# Patient Record
Sex: Female | Born: 1990 | Race: Asian | Hispanic: No | Marital: Single | Smoking: Never smoker
Health system: Southern US, Community
[De-identification: ages and names within clinical notes are randomized; demographics above are authoritative.]

## PROBLEM LIST (undated history)

## (undated) ENCOUNTER — Emergency Department (HOSPITAL_COMMUNITY): Admission: EM | Payer: Medicaid Other | Source: Home / Self Care

## (undated) ENCOUNTER — Inpatient Hospital Stay (HOSPITAL_COMMUNITY): Payer: Self-pay

## (undated) DIAGNOSIS — O0930 Supervision of pregnancy with insufficient antenatal care, unspecified trimester: Secondary | ICD-10-CM

## (undated) DIAGNOSIS — Z789 Other specified health status: Secondary | ICD-10-CM

## (undated) HISTORY — PX: NO PAST SURGERIES: SHX2092

## (undated) HISTORY — DX: Supervision of pregnancy with insufficient antenatal care, unspecified trimester: O09.30

---
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
## 2010-01-05 ENCOUNTER — Ambulatory Visit (HOSPITAL_COMMUNITY): Admission: RE | Admit: 2010-01-05 | Discharge: 2010-01-05 | Payer: Self-pay | Admitting: Obstetrics & Gynecology

## 2010-05-05 ENCOUNTER — Inpatient Hospital Stay (HOSPITAL_COMMUNITY): Admission: AD | Admit: 2010-05-05 | Discharge: 2010-05-07 | Payer: Self-pay | Admitting: Obstetrics & Gynecology

## 2010-05-05 ENCOUNTER — Ambulatory Visit: Payer: Self-pay | Admitting: Advanced Practice Midwife

## 2011-01-05 LAB — CBC
MCH: 21.3 pg — ABNORMAL LOW (ref 26.0–34.0)
MCHC: 32.3 g/dL (ref 30.0–36.0)
MCV: 65.8 fL — ABNORMAL LOW (ref 78.0–100.0)
Platelets: 353 10*3/uL (ref 150–400)
RDW: 16.5 % — ABNORMAL HIGH (ref 11.5–15.5)
WBC: 15.9 10*3/uL — ABNORMAL HIGH (ref 4.0–10.5)

## 2011-02-04 LAB — RPR: RPR Ser Ql: NONREACTIVE

## 2011-02-04 LAB — CBC: RDW: 13.9 % (ref 11.4–15.5)

## 2011-07-23 LAB — URINALYSIS, ROUTINE W REFLEX MICROSCOPIC
Bilirubin Urine: NEGATIVE
Hgb urine dipstick: NEGATIVE
Protein, ur: NEGATIVE
Specific Gravity, Urine: 1.02

## 2012-08-28 ENCOUNTER — Encounter: Payer: Self-pay | Admitting: *Deleted

## 2012-09-16 ENCOUNTER — Encounter: Payer: Self-pay | Admitting: Obstetrics & Gynecology

## 2012-10-04 ENCOUNTER — Inpatient Hospital Stay (HOSPITAL_COMMUNITY): Payer: Medicaid Other

## 2012-10-04 ENCOUNTER — Encounter (HOSPITAL_COMMUNITY): Payer: Self-pay | Admitting: *Deleted

## 2012-10-04 ENCOUNTER — Inpatient Hospital Stay (HOSPITAL_COMMUNITY)
Admission: AD | Admit: 2012-10-04 | Discharge: 2012-10-04 | Disposition: A | Discharge status: Home / Self Care | Payer: Medicaid Other | Source: Ambulatory Visit | Attending: Obstetrics & Gynecology | Admitting: Obstetrics & Gynecology

## 2012-10-04 DIAGNOSIS — R0602 Shortness of breath: Secondary | ICD-10-CM | POA: Insufficient documentation

## 2012-10-04 DIAGNOSIS — N949 Unspecified condition associated with female genital organs and menstrual cycle: Secondary | ICD-10-CM

## 2012-10-04 DIAGNOSIS — O99891 Other specified diseases and conditions complicating pregnancy: Secondary | ICD-10-CM | POA: Insufficient documentation

## 2012-10-04 DIAGNOSIS — R109 Unspecified abdominal pain: Secondary | ICD-10-CM | POA: Insufficient documentation

## 2012-10-04 DIAGNOSIS — O0930 Supervision of pregnancy with insufficient antenatal care, unspecified trimester: Secondary | ICD-10-CM | POA: Insufficient documentation

## 2012-10-04 LAB — URINALYSIS, ROUTINE W REFLEX MICROSCOPIC
Bilirubin Urine: NEGATIVE
Glucose, UA: NEGATIVE mg/dL
Hgb urine dipstick: NEGATIVE
Protein, ur: NEGATIVE mg/dL
pH: 7.5 (ref 5.0–8.0)

## 2012-10-04 LAB — URINE MICROSCOPIC-ADD ON

## 2012-10-04 NOTE — MAU Note (Signed)
Pt reports having a constant lower abd pain and then intermitant cramping. C/O SOB especially when when lays down to sleep. Has not started prenatal care has appointment with health dept in 2 weeks.

## 2012-10-04 NOTE — MAU Provider Note (Addendum)
History     CSN: [id number]  Arrival date and time: 10/04/12 4098   First Provider Initiated Contact with Patient 10/04/12 1830      Chief Complaint  Patient presents with  . Abdominal Cramping   HPI: Pt is a 21 y.o. G3P2002 (dates uncertain; 32.1 by LMP, 27.5 by pt-reported due date of 12/29/2012) presenting with low abdominal pain and SOB when lying flat. States the abdominal pain is in her low abdomen on both sides and worse when she is standing or walking. SOB is only when she lies completely flat and makes it hard for her to rest unless she is on one side or the other, and makes her "very uncomfortable," especially in combination with the pain.   Otherwise has few complaints. No fever/chills, no significant nausea, no vomiting. Reports good PO intake especially of water. No headache, change in vision, or RUQ pain.  Pt has had no prenatal, yet. Her first OB visit at [facility] is on 12/23. She reports no complications with her previous two pregnancies and states they were both delivered at term ("around 38-39 weeks, a week or two early"). Specifically denies problems with blood pressures or blood sugars.  OB History    Grav Para Term Preterm Abortions TAB SAB Ect Mult Living   3 2 2       2       History reviewed. No pertinent past medical history.  History reviewed. No pertinent past surgical history.  History reviewed. No pertinent family history.  History  Substance Use Topics  . Smoking status: Never Smoker   . Smokeless tobacco: Not on file  . Alcohol Use: No    Allergies: Allergies not on file  No prescriptions prior to admission    ROS: See HPI  Physical Exam   Blood pressure 113/65, pulse 94, temperature 97.9 F (36.6 C), temperature source Oral, resp. rate 18, height 5' (1.524 m), weight 61.689 kg (136 lb), last menstrual period 02/22/2012.  Physical Exam  Vitals reviewed. Constitutional: She is oriented to person, place, and time. She  appears well-developed and well-nourished. No distress.  HENT:  Head: Normocephalic and atraumatic.  Eyes: Conjunctivae normal are normal. Pupils are equal, round, and reactive to light.  Neck: Normal range of motion. Neck supple.  Cardiovascular: Normal rate, regular rhythm and normal heart sounds.   No murmur heard. Respiratory: Effort normal and breath sounds normal. She has no wheezes.  GI: Soft. Bowel sounds are normal. She exhibits no distension. There is tenderness (mild bilateral low abdomenal tenderness). There is no rebound.       Fundal height 30 cm above pubic symphysis  Genitourinary: Vagina normal. Pelvic exam was performed with patient prone.       Dilation: Fingertip (internal os closed but cervix feels slightly funneled) Effacement (%): 50 Cervical Position: Posterior Station: Ballotable Presentation: Undeterminable Exam by:: Street, MD; Arita Miss, RN  Musculoskeletal: Normal range of motion. She exhibits no edema.  Neurological: She is alert and oriented to person, place, and time.  Skin: Skin is warm and dry.  Psychiatric: She has a normal mood and affect. Her behavior is normal.    MAU Course  Procedures -fetal fibronectin (collection attempted but swab came out bloody, unable to be sent) -wet prep -US OB limited  MDM -FHR tracing reassuring, baseline 140, mod variability, good accels, single variable decels -cervix fingertip (closed internal os) / 50 / ballotable, but feels funneled between external and internal os -wet prep pending -[place] pending  *  RADIOLOGY REPORT*  Clinical Data: Evaluate cervical length and placental location  LIMITED OBSTETRIC ULTRASOUND  Number of Fetuses: 1  Heart Rate: 122 bpm  Movement: Present  Presentation: Breech  Placental Location: Anterior  Previa: Absent  Amniotic Fluid (Subjective): Normal  Vertical pocket: 4.8cm AFI: 16.5 cm (5%ile 8.8 cm, 95%ile 23.8 cm)  BPD: 7.86cm 31w 4d  MATERNAL FINDINGS:  Cervix: Closed,  measuring 3.2 cm  Uterus/Adnexae: Bilateral ovaries are not discretely visualized.  IMPRESSION:  Single live intrauterine gestation with estimated gestational age  [redacted] weeks 4 days by BPD.  Cervical length 3.2 cm.  No evidence of placental previa.  Original Report Authenticated By: Charline Bills, M.D.    Assessment and Plan  21 y.o. [id number] SIUP, late to care  -most likely round ligament pain and general discomforts of late pregnancy  -dates uncertain; 32.1 by LMP, 27.5 by pt-reported due date, with fundal height 30 cm  -labs and [place] pending  Above discussed with Artelia Laroche, CNM. Pt/care to be checked out/turned over to oncoming providers.  Street, Christopher 10/04/2012, 7:58 PM

## 2012-10-04 NOTE — MAU Provider Note (Signed)
Seen and agree, except may not be just round ligament pain There is uterine irritability and this may represent preterm contractions Will check [place] and reevaluate Wynelle Bourgeois cnm

## 2012-10-04 NOTE — MAU Note (Signed)
"  I have been having some lower abdominal cramping and hard to breathe all day.  (+) FM.  I have my first prenatal appointment with the [facility] on 10/12/12.  The pain is worse when I move around.  The SOB happens when I lay back.  NO VB or LOF."

## 2012-10-06 LAB — URINE CULTURE

## 2012-10-06 NOTE — MAU Provider Note (Signed)
Attestation of Attending Supervision of Resident: Evaluation and management procedures were performed by the Family Medicine Resident under my supervision.  I have reviewed the resident's note and chart, and I agree with the management and plan.  Tashianna Broome, MD, FACOG Attending Obstetrician & Gynecologist Faculty Practice, [facility]  

## 2012-10-11 ENCOUNTER — Inpatient Hospital Stay (HOSPITAL_COMMUNITY)
Admission: AD | Admit: 2012-10-11 | Discharge: 2012-10-11 | Disposition: A | Discharge status: Home / Self Care | Payer: Medicaid Other | Source: Ambulatory Visit | Attending: Obstetrics & Gynecology | Admitting: Obstetrics & Gynecology

## 2012-10-11 ENCOUNTER — Inpatient Hospital Stay (HOSPITAL_COMMUNITY): Payer: Medicaid Other

## 2012-10-11 ENCOUNTER — Encounter (HOSPITAL_COMMUNITY): Payer: Self-pay | Admitting: Family

## 2012-10-11 DIAGNOSIS — O0930 Supervision of pregnancy with insufficient antenatal care, unspecified trimester: Secondary | ICD-10-CM | POA: Insufficient documentation

## 2012-10-11 DIAGNOSIS — O368190 Decreased fetal movements, unspecified trimester, not applicable or unspecified: Secondary | ICD-10-CM | POA: Insufficient documentation

## 2012-10-11 DIAGNOSIS — N390 Urinary tract infection, site not specified: Secondary | ICD-10-CM | POA: Insufficient documentation

## 2012-10-11 DIAGNOSIS — Z3689 Encounter for other specified antenatal screening: Secondary | ICD-10-CM

## 2012-10-11 DIAGNOSIS — O2390 Unspecified genitourinary tract infection in pregnancy, unspecified trimester: Secondary | ICD-10-CM | POA: Insufficient documentation

## 2012-10-11 DIAGNOSIS — O2340 Unspecified infection of urinary tract in pregnancy, unspecified trimester: Secondary | ICD-10-CM

## 2012-10-11 HISTORY — DX: Other specified health status: Z78.9

## 2012-10-11 LAB — URINALYSIS, ROUTINE W REFLEX MICROSCOPIC
Bilirubin Urine: NEGATIVE
Hgb urine dipstick: NEGATIVE
Ketones, ur: NEGATIVE mg/dL

## 2012-10-11 LAB — URINE MICROSCOPIC-ADD ON

## 2012-10-11 MED ORDER — CEPHALEXIN 500 MG PO CAPS: 500.0000 mg | ORAL_CAPSULE | Freq: Four times a day (QID) | ORAL | Status: DC

## 2012-10-11 NOTE — MAU Provider Note (Signed)
History     CSN: [id number]  Arrival date and time: 10/11/12 4098   None     Chief Complaint  Patient presents with  . Decreased Fetal Movement   HPI  Pt is a G3P2002 at 33.1 wks IUP here with report of decreased fetal movement since 0500 this am.  No report of vaginal bleeding or leaking of fluids.  Occasional Deberah Pelton.  Appt at [facility] Dept tomorrow.    Past Medical History  Diagnosis Date  . No pertinent past medical history     Past Surgical History  Procedure Date  . No past surgeries     History reviewed. No pertinent family history.  History  Substance Use Topics  . Smoking status: Never Smoker   . Smokeless tobacco: Not on file  . Alcohol Use: No    Allergies: No Known Allergies  Prescriptions prior to admission  Medication Sig Dispense Refill  . acetaminophen (TYLENOL) 325 MG tablet Take 650 mg by mouth every 6 (six) hours as needed.      . Prenatal Vit-Fe Fumarate-FA (MULTIVITAMIN-PRENATAL) 27-0.8 MG TABS Take 1 tablet by mouth daily.        Review of Systems  Constitutional:       Decreased fetal movement.  Gastrointestinal: Positive for abdominal pain [facility]).  Genitourinary: Negative.   All other systems reviewed and are negative.   Physical Exam   Blood pressure 120/71, pulse 93, temperature 97.3 F (36.3 C), temperature source Oral, resp. rate 18, height 5' (1.524 m), weight 60.782 kg (134 lb), last menstrual period 02/22/2012, SpO2 100.00%.  Physical Exam  Constitutional: She is oriented to person, place, and time. She appears well-developed and well-nourished. No distress.  HENT:  Head: Normocephalic.  Neck: Normal range of motion. Neck supple.  Cardiovascular: Normal rate, regular rhythm and normal heart sounds.   Respiratory: Effort normal and breath sounds normal.  GI: Soft. There is no tenderness.       Fundal ht 32  Genitourinary: No bleeding around the vagina. Vaginal discharge (mucusy) found.  Neurological: She  is alert and oriented to person, place, and time.  Skin: Skin is warm and dry.  Dilation: Closed Effacement (%): Thick Cervical Position: Posterior Exam by:: Roney Marion, CNM  FHR 110's, +accels, reactive Toco - irritability   MAU Course  Procedures Results for orders placed during the hospital encounter of 10/11/12 (from the past 24 hour(s))  URINALYSIS, ROUTINE W REFLEX MICROSCOPIC     Status: Abnormal   Collection Time   10/11/12  6:24 AM      Component Value Range   Color, Urine YELLOW  YELLOW   APPearance HAZY (*) CLEAR   Specific Gravity, Urine 1.010  1.005 - 1.030   pH 7.5  5.0 - 8.0   Glucose, UA NEGATIVE  NEGATIVE mg/dL   Hgb urine dipstick NEGATIVE  NEGATIVE   Bilirubin Urine NEGATIVE  NEGATIVE   Ketones, ur NEGATIVE  NEGATIVE mg/dL   Protein, ur NEGATIVE  NEGATIVE mg/dL   Urobilinogen, UA 0.2  0.0 - 1.0 mg/dL   Nitrite NEGATIVE  NEGATIVE   Leukocytes, UA MODERATE (*) NEGATIVE  URINE MICROSCOPIC-ADD ON     Status: Abnormal   Collection Time   10/11/12  6:24 AM      Component Value Range   Squamous Epithelial / LPF FEW (*) RARE   WBC, UA 11-20  <3 WBC/hpf   Bacteria, UA FEW (*) RARE   Imaging:  *RADIOLOGY REPORT*  Clinical Data: 21 year old  G3 P2, EDD 11/28/2012 (33 weeks 1 day),  presenting with decreased fetal movement.   LIMITED OBSTETRIC ULTRASOUND WITH BIOPHYSICAL PROFILE  Number of Fetuses: 1  Heart Rate: 130 bpm  Movement: Visualized.  Breathing: Not visualized.  Presentation: Transverse, head maternal right.  Placental Location: Anterior  Previa: No.  Amniotic Fluid (Subjective): Normal.  Vertical Pocket 5.6 cm AFI 14.2 cm (5%ile = 8.3 cm; 95%ile =  24.5 cm)  BPD: 8.1 cm 32 w 4 d EDC: 12/02/2012   MATERNAL FINDINGS:  Cervix: Closed, approximately 3.3 cm in length.  Uterus/Adnexae: Nonvisualization of the ovaries. No adnexal  masses or free pelvic fluid.  BIOPHYSICAL PROFILE  Movement: 2 Time: 30 minutes  Breathing: 0  Tone: 2  Amniotic  Fluid: 2  Total Score: 6/8   IMPRESSION:  1. Single live intrauterine fetus in transverse presentation (head  maternal right) with estimated gestational age [redacted] weeks 4 days by  BPD, corresponding well with the assigned gestational age of [redacted]  weeks 1 day.  2. Biophysical profile score 6/8 (fetal breathing was not  visualized).  3. Subjectively and objectively normal amniotic fluid.  4. Closed cervix measuring approximately 3.3 cm in length.  Recommend followup with non-emergent complete OB 14+ wk US  examination for fetal biometric evaluation and anatomic survey if  not already performed.  Original Report Authenticated By: Hulan Saas, M.D.    Assessment and Plan  A: UTI in pregnancy Late Prenatal Care  Reactive NST  P: Discharge home Rx for Keflex sent to pharmacy Patient informed to keep appointment to start prenatal care at [facility] as scheduled   [facility] 10/11/2012, 7:06 AM   Report given to N. Bascom Levels who assumes care of patient.

## 2012-10-11 NOTE — MAU Note (Signed)
Pt reports no fetal movement x 1.5 hours, lower abd pain

## 2012-10-12 LAB — URINE CULTURE
Colony Count: NO GROWTH
Culture: NO GROWTH

## 2012-10-20 LAB — OB RESULTS CONSOLE RUBELLA ANTIBODY, IGM: Rubella: IMMUNE

## 2012-10-20 LAB — OB RESULTS CONSOLE GC/CHLAMYDIA: Chlamydia: NEGATIVE

## 2012-10-20 LAB — OB RESULTS CONSOLE HEPATITIS B SURFACE ANTIGEN: Hepatitis B Surface Ag: NEGATIVE

## 2012-10-20 LAB — OB RESULTS CONSOLE HIV ANTIBODY (ROUTINE TESTING): HIV: NONREACTIVE

## 2012-10-20 LAB — OB RESULTS CONSOLE ABO/RH: RH Type: POSITIVE

## 2012-10-20 LAB — OB RESULTS CONSOLE RPR: RPR: NONREACTIVE

## 2012-10-21 NOTE — L&D Delivery Note (Signed)
I was present for the delivery and agree with above. I performed the repair of the 2nd degree laceration.   Placenta to: BS Feeding: Breast Circ: NA Contraception: Condoms   [place], [place] 12/06/2012 9:19 PM

## 2012-10-21 NOTE — L&D Delivery Note (Signed)
Delivery Note At 8:25 PM a viable female was delivered via Vaginal, Spontaneous Delivery (Presentation: Right Occiput Anterior).  APGAR: 9, 9; weight .   Placenta status: Intact, Spontaneous.  Cord: 3 vessels with the following complications: None.    Anesthesia:   Episiotomy: None Lacerations: 2nd degree Suture Repair: 3.0 vicryl Est. Blood Loss (mL): 400  Mom to postpartum.  Baby to nursery-stable.  Mat Carne 12/06/2012, 8:50 PM

## 2012-10-22 ENCOUNTER — Other Ambulatory Visit (HOSPITAL_COMMUNITY): Payer: Self-pay | Admitting: Nurse Practitioner

## 2012-10-22 DIAGNOSIS — Z0489 Encounter for examination and observation for other specified reasons: Secondary | ICD-10-CM

## 2012-10-26 ENCOUNTER — Ambulatory Visit (HOSPITAL_COMMUNITY)
Admission: RE | Admit: 2012-10-26 | Discharge: 2012-10-26 | Disposition: A | Discharge status: Home / Self Care | Payer: Medicaid Other | Source: Ambulatory Visit | Attending: Nurse Practitioner | Admitting: Nurse Practitioner

## 2012-10-26 DIAGNOSIS — Z3689 Encounter for other specified antenatal screening: Secondary | ICD-10-CM | POA: Insufficient documentation

## 2012-10-26 DIAGNOSIS — Z0489 Encounter for examination and observation for other specified reasons: Secondary | ICD-10-CM

## 2012-10-26 DIAGNOSIS — O0930 Supervision of pregnancy with insufficient antenatal care, unspecified trimester: Secondary | ICD-10-CM | POA: Insufficient documentation

## 2012-11-10 LAB — OB RESULTS CONSOLE GBS: GBS: NEGATIVE

## 2012-11-15 ENCOUNTER — Encounter (HOSPITAL_COMMUNITY): Payer: Self-pay | Admitting: Obstetrics and Gynecology

## 2012-11-15 ENCOUNTER — Inpatient Hospital Stay (HOSPITAL_COMMUNITY)
Admission: AD | Admit: 2012-11-15 | Discharge: 2012-11-15 | Disposition: A | Discharge status: Home / Self Care | Payer: Medicaid Other | Source: Ambulatory Visit | Attending: Obstetrics & Gynecology | Admitting: Obstetrics & Gynecology

## 2012-11-15 DIAGNOSIS — O479 False labor, unspecified: Secondary | ICD-10-CM | POA: Insufficient documentation

## 2012-11-15 NOTE — MAU Note (Signed)
"  I started having contractions this morning.  This afternoon they became about every 15 mins.  Now they are about every 10 mins.  No VB or LOF. (+) FM."

## 2012-11-15 NOTE — MAU Provider Note (Signed)
Attestation of Attending Supervision of Advanced Practitioner (CNM/NP): Evaluation and management procedures were performed by the Advanced Practitioner under my supervision and collaboration.  I have reviewed the Advanced Practitioner's note and chart, and I agree with the management and plan.  HARRAWAY-SMITH, Kaiyu Mirabal 9:24 PM     

## 2012-11-15 NOTE — MAU Provider Note (Signed)
Chief Complaint:  Contractions   First Provider Initiated Contact with Patient 11/15/12 2112      HPI: Rebecca Bryan is a 22 y.o. G3P2002 at [redacted]w[redacted]d who presents to maternity admissions reporting contractions beginning this AM.  Pt states that she started to feel contractions this AM about 20-30 minutes apart with slight abdominal pain/pressure. Her contractions increased to every 10-15 minutes this afternoon and decided to come in.  Pt [facility] her prenatal [facility] department and was checked one week ago (2/10/-3).   Denies leakage of fluid or vaginal bleeding. Good fetal movement.   Pregnancy Course:   Past Medical History: Past Medical History  Diagnosis Date  . No pertinent past medical history     Past obstetric history: OB History    Grav Para Term Preterm Abortions TAB SAB Ect Mult Living   3 2 2       2      # Outc Date GA Lbr Len/2nd Wgt Sex Del Anes PTL Lv   1 TRM 2010 [redacted]w[redacted]d  6lb2oz(2.778kg) M SVD None  Yes   Comments: No complications   2 TRM 2011 [redacted]w[redacted]d  6lb(2.722kg) F SVD None  Yes   3 CUR               Past Surgical History: Past Surgical History  Procedure Date  . No past surgeries     Family History: History reviewed. No pertinent family history.  Social History: History  Substance Use Topics  . Smoking status: Never Smoker   . Smokeless tobacco: Not on file  . Alcohol Use: No    Allergies: No Known Allergies  Meds:  Prescriptions prior to admission  Medication Sig Dispense Refill  . Prenatal Vit-Fe Fumarate-FA (PRENATAL MULTIVITAMIN) TABS Take 1 tablet by mouth daily.        ROS: Pertinent findings in history of present illness.  Physical Exam  Blood pressure 113/75, pulse 94, temperature 98.7 F (37.1 C), temperature source Oral, resp. rate 18, height 5' (1.524 m), weight 61.871 kg (136 lb 6.4 oz), last menstrual period 02/22/2012. GENERAL: Well-developed, well-nourished female in no acute distress.  HEENT: normocephalic HEART:  normal rate RESP: normal effort ABDOMEN: Soft, non-tender, gravid appropriate for gestational age EXTREMITIES: Nontender, no edema NEURO: alert and oriented  Dilation: 2 Effacement (%): 20 Cervical Position: Posterior Station: -3 Exam by:: Dr. Paulina Fusi  FHT:  Baseline 150 , moderate variability, accelerations present, no decelerations Contractions: 10-15 minutes    Labs: No results found for this or any previous visit (from the past 24 hour(s)).  Imaging:  US Ob Comp + 14 Wk  10/26/2012  OBSTETRICAL ULTRASOUND: This exam was performed within a [facility]. The OB US report was generated in the AS system, and faxed to the ordering physician.   This report is also available in [employer] and in the [employer]. See [facility] Obstetric US report.   Assessment: 1. Braxton Hick's contraction     Plan: Discharge home Labor precautions and fetal kick counts    Medication List     As of 11/15/2012  9:15 PM    ASK your doctor about these medications         prenatal multivitamin Tabs   Take 1 tablet by mouth daily.        [name], DO of [facility] 11/15/2012, 9:15 PM  I have seen the patient with the resident and agree with the above.

## 2012-12-04 ENCOUNTER — Other Ambulatory Visit: Payer: Medicaid Other

## 2012-12-04 ENCOUNTER — Telehealth (HOSPITAL_COMMUNITY): Payer: Self-pay | Admitting: *Deleted

## 2012-12-04 ENCOUNTER — Encounter (HOSPITAL_COMMUNITY): Payer: Self-pay | Admitting: *Deleted

## 2012-12-04 NOTE — Telephone Encounter (Signed)
Preadmission screen  

## 2012-12-06 ENCOUNTER — Inpatient Hospital Stay (HOSPITAL_COMMUNITY)
Admission: AD | Admit: 2012-12-06 | Discharge: 2012-12-08 | DRG: 775 | Disposition: A | Discharge status: Home / Self Care | Payer: Medicaid Other | Source: Ambulatory Visit | Attending: Obstetrics & Gynecology | Admitting: Obstetrics & Gynecology

## 2012-12-06 ENCOUNTER — Encounter (HOSPITAL_COMMUNITY): Payer: Self-pay | Admitting: *Deleted

## 2012-12-06 HISTORY — DX: Other specified health status: Z78.9

## 2012-12-06 LAB — CBC
HCT: 34.1 % — ABNORMAL LOW (ref 36.0–46.0)
Hemoglobin: 11 g/dL — ABNORMAL LOW (ref 12.0–15.0)
MCV: 66 fL — ABNORMAL LOW (ref 78.0–100.0)
RBC: 5.17 MIL/uL — ABNORMAL HIGH (ref 3.87–5.11)
RDW: 15.7 % — ABNORMAL HIGH (ref 11.5–15.5)
WBC: 16.4 10*3/uL — ABNORMAL HIGH (ref 4.0–10.5)

## 2012-12-06 MED ORDER — OXYTOCIN 40 UNITS IN LACTATED RINGERS INFUSION - SIMPLE MED
INTRAVENOUS | Status: AC
  Filled 2012-12-06: qty 1000

## 2012-12-06 MED ORDER — LACTATED RINGERS IV SOLN
INTRAVENOUS | Status: DC
  Administered 2012-12-06: 19:00:00 via INTRAVENOUS

## 2012-12-06 MED ORDER — LIDOCAINE HCL (PF) 1 % IJ SOLN
INTRAMUSCULAR | Status: AC
  Filled 2012-12-06: qty 30

## 2012-12-06 MED ORDER — OXYCODONE-ACETAMINOPHEN 5-325 MG PO TABS: 1.0000 | ORAL_TABLET | ORAL | Status: DC | PRN

## 2012-12-06 MED ORDER — OXYTOCIN BOLUS FROM INFUSION
500.0000 mL | INTRAVENOUS | Status: DC
  Administered 2012-12-06: 500 mL via INTRAVENOUS

## 2012-12-06 MED ORDER — SIMETHICONE 80 MG PO CHEW: 80.0000 mg | CHEWABLE_TABLET | ORAL | Status: DC | PRN

## 2012-12-06 MED ORDER — DIPHENHYDRAMINE HCL 25 MG PO CAPS: 25.0000 mg | ORAL_CAPSULE | Freq: Four times a day (QID) | ORAL | Status: DC | PRN

## 2012-12-06 MED ORDER — TETANUS-DIPHTH-ACELL PERTUSSIS 5-2.5-18.5 LF-MCG/0.5 IM SUSP
0.5000 mL | Freq: Once | INTRAMUSCULAR | Status: AC
  Administered 2012-12-07: 0.5 mL via INTRAMUSCULAR
  Filled 2012-12-06: qty 0.5

## 2012-12-06 MED ORDER — ONDANSETRON HCL 4 MG/2ML IJ SOLN: 4.0000 mg | Freq: Four times a day (QID) | INTRAMUSCULAR | Status: DC | PRN

## 2012-12-06 MED ORDER — IBUPROFEN 600 MG PO TABS
600.0000 mg | ORAL_TABLET | Freq: Four times a day (QID) | ORAL | Status: DC | PRN
  Administered 2012-12-06: 600 mg via ORAL
  Filled 2012-12-06: qty 1

## 2012-12-06 MED ORDER — IBUPROFEN 600 MG PO TABS
600.0000 mg | ORAL_TABLET | Freq: Four times a day (QID) | ORAL | Status: DC
  Administered 2012-12-07 – 2012-12-08 (×6): 600 mg via ORAL
  Filled 2012-12-06 (×6): qty 1

## 2012-12-06 MED ORDER — ONDANSETRON HCL 4 MG PO TABS: 4.0000 mg | ORAL_TABLET | ORAL | Status: DC | PRN

## 2012-12-06 MED ORDER — NALBUPHINE SYRINGE 5 MG/0.5 ML: 10.0000 mg | INJECTION | INTRAMUSCULAR | Status: DC | PRN

## 2012-12-06 MED ORDER — OXYCODONE-ACETAMINOPHEN 5-325 MG PO TABS
1.0000 | ORAL_TABLET | ORAL | Status: DC | PRN
  Administered 2012-12-06 – 2012-12-07 (×2): 1 via ORAL
  Filled 2012-12-06 (×2): qty 1

## 2012-12-06 MED ORDER — OXYTOCIN 40 UNITS IN LACTATED RINGERS INFUSION - SIMPLE MED: 62.5000 mL/h | INTRAVENOUS | Status: DC

## 2012-12-06 MED ORDER — CITRIC ACID-SODIUM CITRATE 334-500 MG/5ML PO SOLN: 30.0000 mL | ORAL | Status: DC | PRN

## 2012-12-06 MED ORDER — SENNOSIDES-DOCUSATE SODIUM 8.6-50 MG PO TABS
2.0000 | ORAL_TABLET | Freq: Every day | ORAL | Status: DC
  Administered 2012-12-07: 2 via ORAL

## 2012-12-06 MED ORDER — DIBUCAINE 1 % RE OINT: 1.0000 "application " | TOPICAL_OINTMENT | RECTAL | Status: DC | PRN

## 2012-12-06 MED ORDER — ONDANSETRON HCL 4 MG/2ML IJ SOLN: 4.0000 mg | INTRAMUSCULAR | Status: DC | PRN

## 2012-12-06 MED ORDER — PRENATAL MULTIVITAMIN CH
1.0000 | ORAL_TABLET | Freq: Every day | ORAL | Status: DC
  Administered 2012-12-07 – 2012-12-08 (×2): 1 via ORAL
  Filled 2012-12-06 (×2): qty 1

## 2012-12-06 MED ORDER — ACETAMINOPHEN 325 MG PO TABS: 650.0000 mg | ORAL_TABLET | ORAL | Status: DC | PRN

## 2012-12-06 MED ORDER — LACTATED RINGERS IV SOLN: 500.0000 mL | INTRAVENOUS | Status: DC | PRN

## 2012-12-06 MED ORDER — METHYLERGONOVINE MALEATE 0.2 MG PO TABS: 0.2000 mg | ORAL_TABLET | ORAL | Status: DC | PRN

## 2012-12-06 MED ORDER — WITCH HAZEL-GLYCERIN EX PADS: 1.0000 "application " | MEDICATED_PAD | CUTANEOUS | Status: DC | PRN

## 2012-12-06 MED ORDER — METHYLERGONOVINE MALEATE 0.2 MG/ML IJ SOLN
0.2000 mg | INTRAMUSCULAR | Status: DC | PRN
  Administered 2012-12-06: 0.2 mg via INTRAMUSCULAR

## 2012-12-06 MED ORDER — LIDOCAINE HCL (PF) 1 % IJ SOLN
30.0000 mL | INTRAMUSCULAR | Status: DC | PRN
  Administered 2012-12-06: 30 mL via SUBCUTANEOUS
  Filled 2012-12-06: qty 30

## 2012-12-06 MED ORDER — LANOLIN HYDROUS EX OINT: TOPICAL_OINTMENT | CUTANEOUS | Status: DC | PRN

## 2012-12-06 MED ORDER — BENZOCAINE-MENTHOL 20-0.5 % EX AERO
1.0000 "application " | INHALATION_SPRAY | CUTANEOUS | Status: DC | PRN
  Administered 2012-12-06: 1 via TOPICAL
  Filled 2012-12-06: qty 56

## 2012-12-06 MED ORDER — FENTANYL CITRATE 0.05 MG/ML IJ SOLN
100.0000 ug | INTRAMUSCULAR | Status: DC | PRN
  Administered 2012-12-06: 100 ug via INTRAVENOUS
  Filled 2012-12-06: qty 2

## 2012-12-06 MED ORDER — ZOLPIDEM TARTRATE 5 MG PO TABS: 5.0000 mg | ORAL_TABLET | Freq: Every evening | ORAL | Status: DC | PRN

## 2012-12-06 NOTE — H&P (Signed)
Rebecca Bryan is a 22 y.o. female [id number] at 41.1wks presenting for eval of leaking fluid since approx 0830. She began with mild ctx at the time of leaking and the ctx began to get stronger this afternoon. Denies fever, N/V, or H/A. Her preg has been followed by the [facility] and has been remarkable for onset of care at 34wks. EDC was determined by LMP and confirmed by 32wk biparietal measurement. History OB History   Grav Para Term Preterm Abortions TAB SAB Ect Mult Living   4 2 2  0 1 0 1 0 0 2     Past Medical History  Diagnosis Date  . No pertinent past medical history   . Late prenatal care   . Medical history non-contributory    Past Surgical History  Procedure Laterality Date  . No past surgeries     Family History: family history is not on file. Social History:  reports that she has never smoked. She has never used smokeless tobacco. She reports that she does not drink alcohol or use illicit drugs.   Prenatal Transfer Tool  Maternal Diabetes: No Genetic Screening: Declined- too late Maternal Ultrasounds/Referrals: Normal Fetal Ultrasounds or other Referrals:  None Maternal Substance Abuse:  No Significant Maternal Medications:  None Significant Maternal Lab Results:  Lab values include: Group B Strep negative Other Comments:  onset of prenatal care at 34wks  ROS  Dilation: 7 Effacement (%): 100 Station: 0 Exam by:: K Shaw CNM Blood pressure 117/68, pulse 114, temperature 97.9 F (36.6 C), temperature source Oral, resp. rate 19, height 5' (1.524 m), weight 139 lb (63.05 kg), last menstrual period 02/22/2012, unknown if currently breastfeeding. Maternal Exam:  Uterine Assessment: Ctx q 7 mins, mild-mod  Cervix: Cx 7/100/0, SROM with exam for clear fluid  Fetal Exam Fetal Monitor Review: Baseline rate: 145.  Variability: moderate (6-25 bpm).   Pattern: accelerations present and no decelerations.       Physical Exam  Constitutional: She is oriented to person, place,  and time. She appears well-developed.  HENT:  Head: Normocephalic.  Cardiovascular:  tachycardic  Respiratory: Effort normal.  Genitourinary: Vagina normal.  Musculoskeletal: Normal range of motion.  Neurological: She is alert and oriented to person, place, and time.  Skin: Skin is warm and dry.  Psychiatric: She has a normal mood and affect. Her behavior is normal. Thought content normal.    Prenatal labs: ABO, Rh: O/Positive/-- (12/31 0000) Antibody: Negative (12/31 0000) Rubella: Immune (12/31 0000) RPR: Nonreactive (12/31 0000)  HBsAg: Negative (12/31 0000)  HIV: Non-reactive (12/31 0000)  GBS: Negative (01/21 0000)   Assessment/Plan: IUP at 41.1 wks Active labor GBS neg  Admit to L&D Expectant management- plans Fentanyl for pain Anticipate SVD    Cam Hai 12/06/2012, 6:55 PM

## 2012-12-06 NOTE — Progress Notes (Signed)
Pincus Badder CNM notified pt on L/D for traige: G4P2 @ 41.1 wks Late to prenatal care 10-20-12. Here with c/o possible SROM 0830, pink tinged fluid. Scheduled for IOL for PD 2-18.  UCs q 5 min per toco. Will come do spec exam.

## 2012-12-07 LAB — CBC
Platelets: 280 10*3/uL (ref 150–400)
RBC: 4.53 MIL/uL (ref 3.87–5.11)
WBC: 21.4 10*3/uL — ABNORMAL HIGH (ref 4.0–10.5)

## 2012-12-07 LAB — ABO/RH: ABO/RH(D): O POS

## 2012-12-07 NOTE — Progress Notes (Signed)
UR chart review completed.  

## 2012-12-07 NOTE — Progress Notes (Signed)
I was present for the exam and agree with above.  [place], [place] 12/07/2012 7:47 AM

## 2012-12-07 NOTE — Progress Notes (Signed)
Post Partum Day 1 Subjective: no complaints  Objective: Blood pressure 99/67, pulse 102, temperature 98 F (36.7 C), temperature source Axillary, resp. rate 20, height 5' (1.524 m), weight 63.05 kg (139 lb), last menstrual period 02/22/2012, unknown if currently breastfeeding.  Physical Exam:  General: alert, cooperative and no distress Lochia: appropriate Uterine Fundus: firm DVT Evaluation: No evidence of DVT seen on physical exam. Negative Homan's sign.   Recent Labs  12/06/12 1900 12/07/12 0538  HGB 11.0* 9.5*  HCT 34.1* 29.7*    Assessment/Plan: Plan for discharge tomorrow morning.  Breastfeeding: Patient states she has not tried yet, but will in the future. Using bottle now. Contraception: condoms Circumcision: N/A Pain under control with motrin.    LOS: 1 day   Rebecca Bryan 12/07/2012, 7:41 AM

## 2012-12-08 NOTE — Progress Notes (Signed)
Pt discharged before CSW could assess reason for LPNC @ 34 weeks. CSW will monitor drug screen results and make a referral if needed.      

## 2012-12-08 NOTE — Discharge Summary (Signed)
22 year old [phone] who was late to [facility] and presented in active labor and had NSVD of healthy viable female.   Obstetric Discharge Summary Reason for Admission: onset of labor Prenatal Procedures: none Intrapartum Procedures: spontaneous vaginal delivery Postpartum Procedures: none Complications-Operative and Postpartum: 2nd degree perineal laceration Hemoglobin  Date Value Range Status  12/07/2012 9.5* 12.0 - 15.0 g/dL Final     HCT  Date Value Range Status  12/07/2012 29.7* 36.0 - 46.0 % Final    Physical Exam:  General: alert, cooperative and no distress Lochia: appropriate Uterine Fundus: firm Incision: n/a DVT Evaluation: No evidence of DVT seen on physical exam.  Discharge Diagnoses: Term Pregnancy-delivered  Discharge Information: Date: 12/08/2012 Activity: unrestricted Diet: routine Medications: None Condition: stable Instructions: refer to practice specific booklet Discharge to: home   Newborn Data: Live born female  Birth Weight: 7 lb 5.8 oz (3340 g) APGAR: 9, 9  Home with mother.  Mat Carne, MD 12/08/2012, 7:39 AM   I saw and examined patient and agree with above resident note. Napoleon Form, MD

## 2012-12-10 NOTE — Discharge Summary (Signed)
Attestation of Attending Supervision of Advanced Practitioner (CNM/NP): Evaluation and management procedures were performed by the Advanced Practitioner under my supervision and collaboration.  I have reviewed the Advanced Practitioner's note and chart, and I agree with the management and plan.  Sherlyn Ebbert 12/10/2012 11:54 AM

## 2014-02-13 IMAGING — US US FETAL BPP W/O NONSTRESS
1 series · 13 of 20 positions shown · non-contrast
Comparison: none

presenting with decreased fetal movement.

LIMITED OBSTETRIC ULTRASOUND WITH BIOPHYSICAL PROFILE
Number of Fetuses: 1
Heart Rate: 130 bpm
Movement: Visualized.
Breathing:  Not visualized.
Presentation: Transverse, head maternal right.
Placental Location: Anterior
Previa: No.
Amniotic Fluid (Subjective): Normal.
Vertical Pocket 5.6 cm     AFI 14.2 cm (5%ile = 8.3 cm; 95%ile =
24.5 cm)
BPD:  8.1 cm      32 w 4 d          EDC:  12/02/2012
MATERNAL FINDINGS:
Cervix: Closed, approximately 3.3 cm in length.
Uterus/Adnexae:  Nonvisualization of the ovaries.  No adnexal
masses or free pelvic fluid.
BIOPHYSICAL PROFILE
Movement: 2                   Time: 30 minutes
Breathing: 0
Tone: 2
Amniotic Fluid: 2
Total Score: [DATE]

[Series 1: us fetal bpp w/o nonstress · non-contrast · 20 acquisitions, 13 frames shown]
[im 1/20]
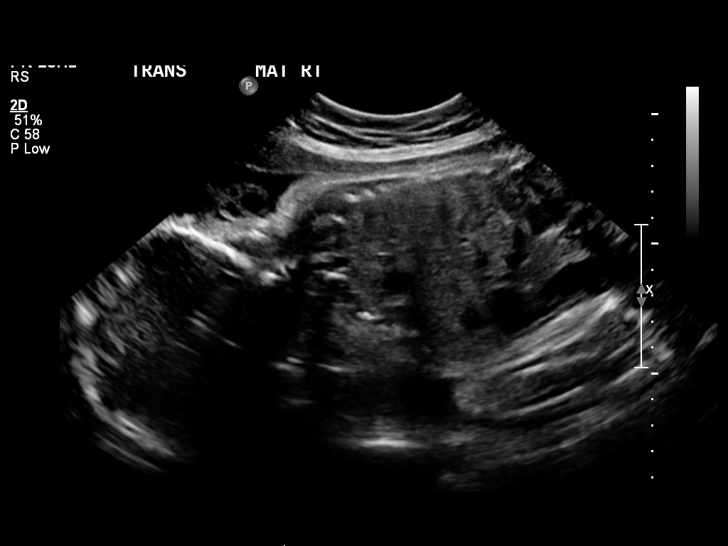
[im 3/20]
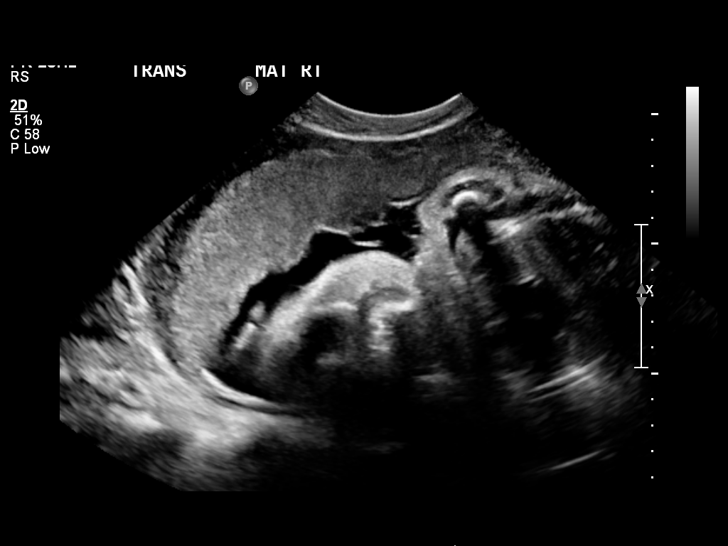
[im 4/20]
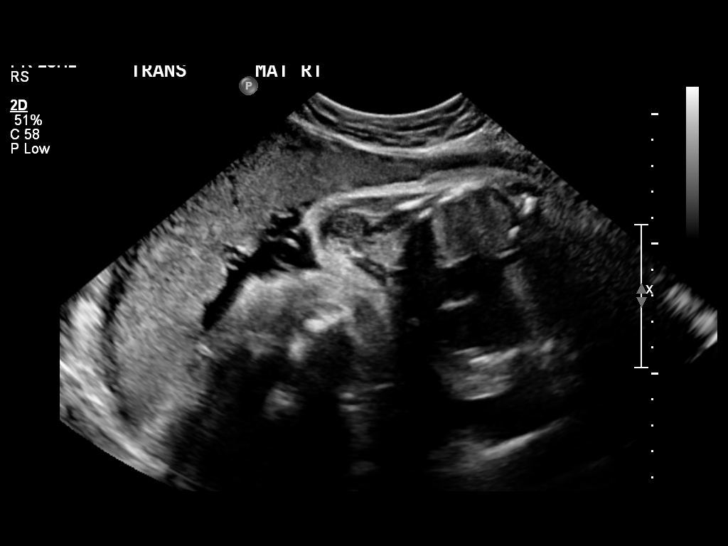
[im 6/20]
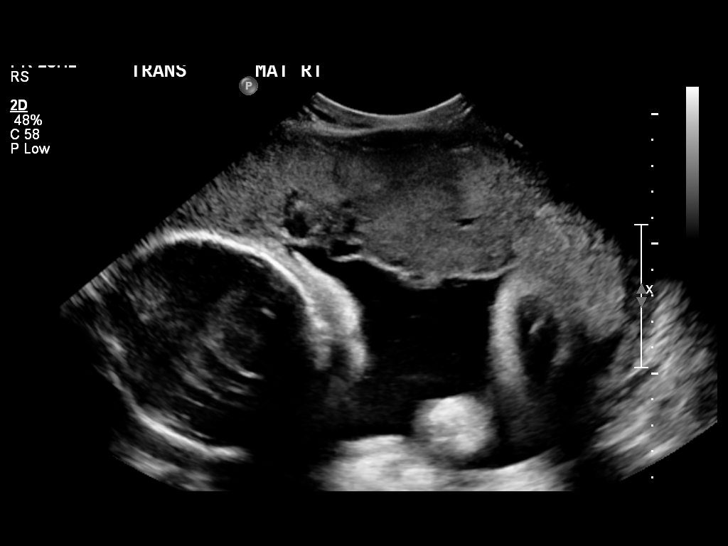
[im 7/20]
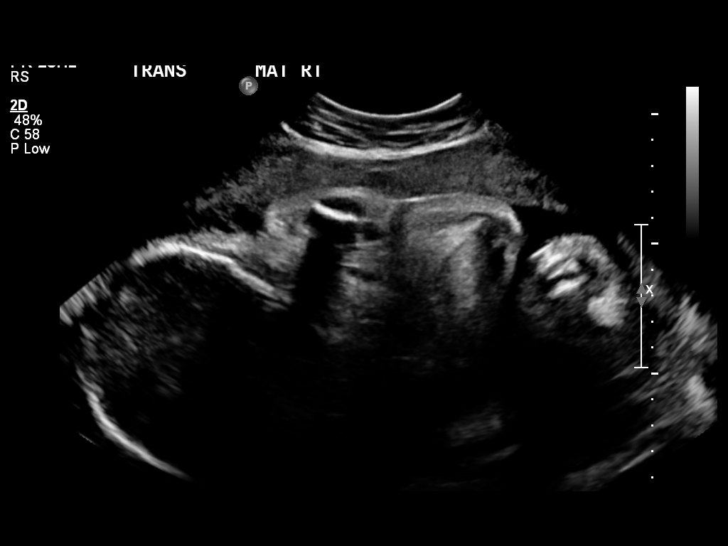
[im 9/20]
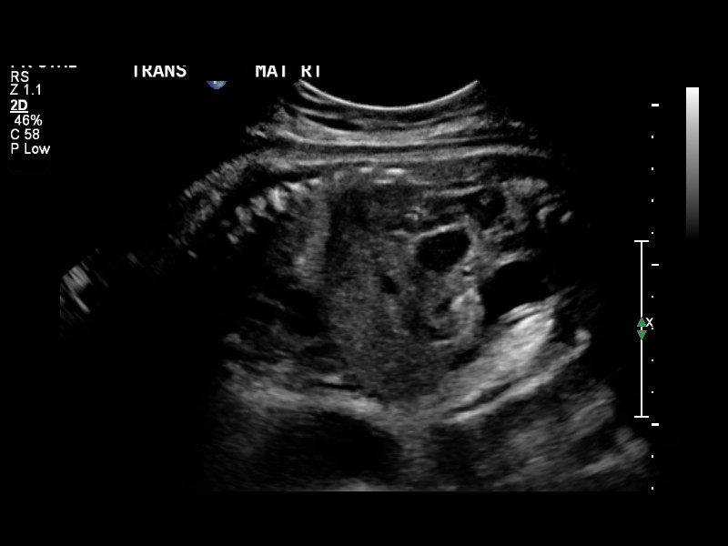
[im 11/20]
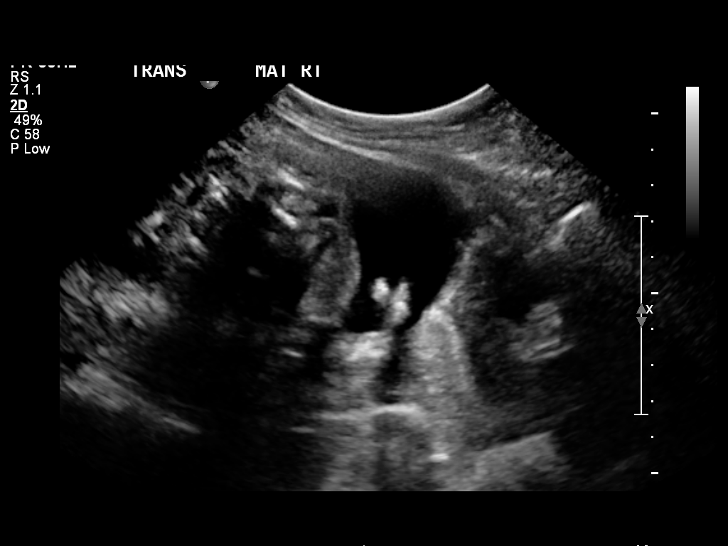
[im 12/20]
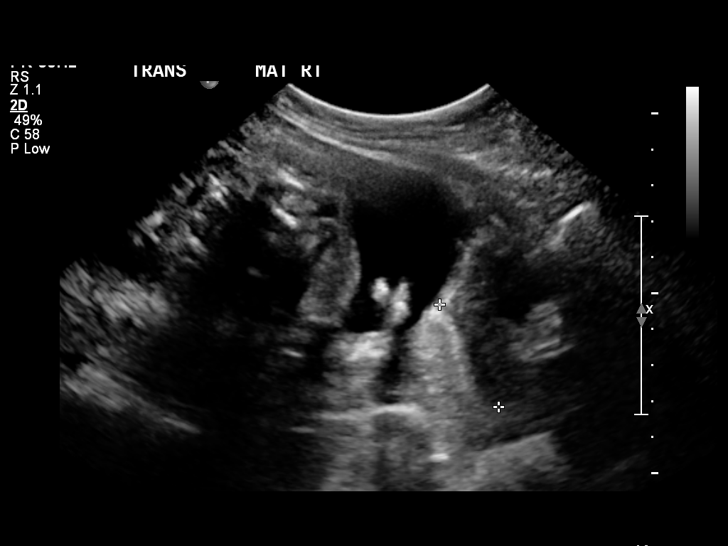
[im 14/20]
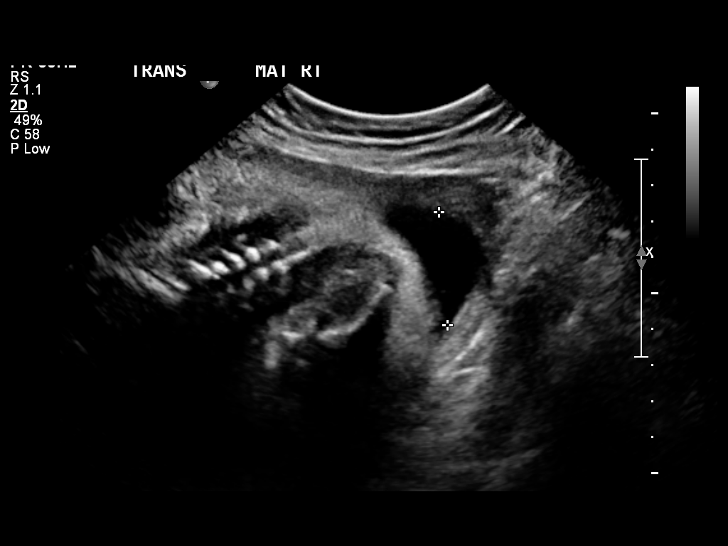
[im 15/20]
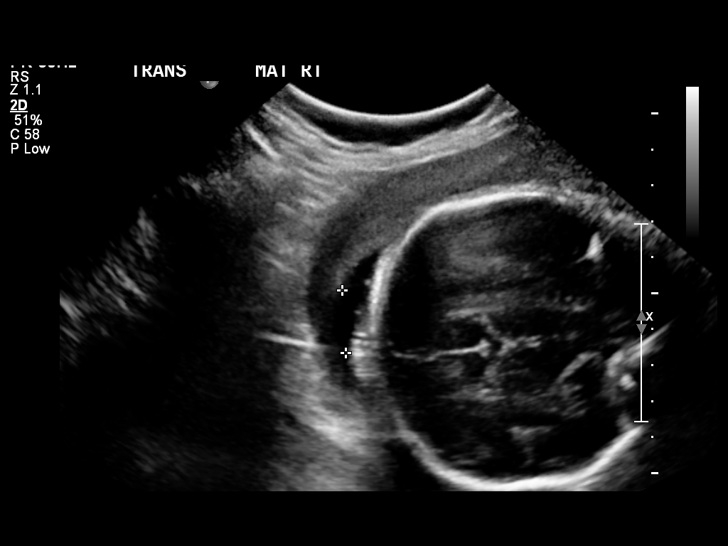
[im 17/20]
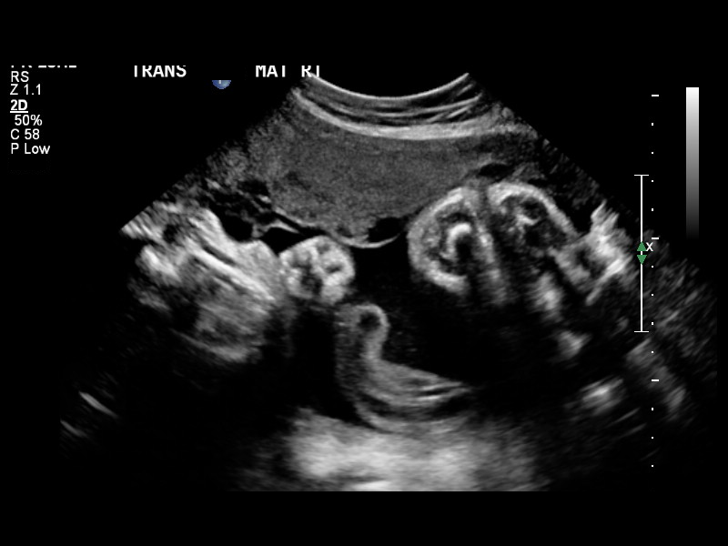
[im 18/20]
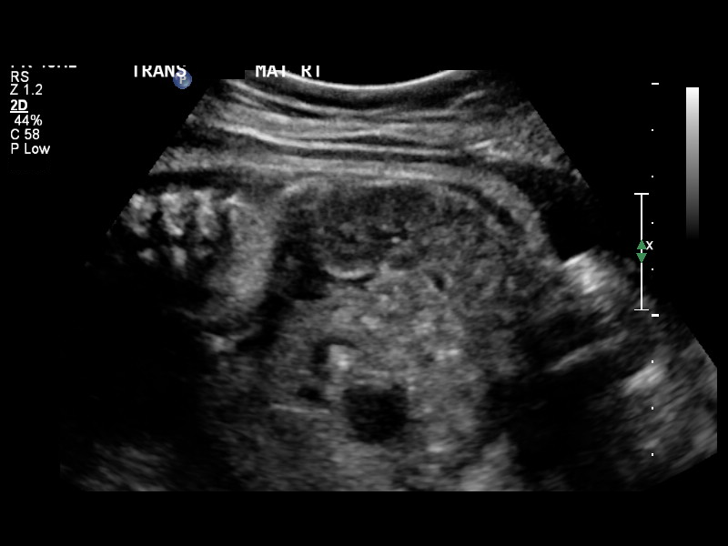
[im 20/20]
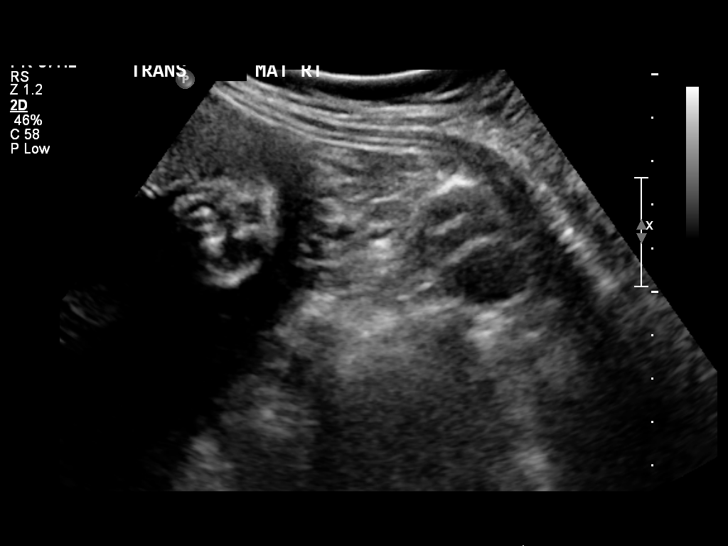

[13 of 20 positions shown; findings below may reference images not displayed]

IMPRESSION: 1.  Single live intrauterine fetus in transverse presentation (head
maternal right) with estimated gestational age 32 weeks 4 days by
BPD, corresponding well with the assigned gestational age of 33
weeks 1 day.
2.  Biophysical profile score [DATE] (fetal breathing was not
visualized).
3.  Subjectively and objectively normal amniotic fluid.
4.  Closed cervix measuring approximately 3.3 cm in length.

Recommend followup with non-emergent complete OB 14+ wk US
examination for fetal biometric evaluation and anatomic survey if
not already performed.

## 2014-08-22 ENCOUNTER — Encounter (HOSPITAL_COMMUNITY): Payer: Self-pay | Admitting: *Deleted

## 2016-06-13 ENCOUNTER — Encounter (HOSPITAL_COMMUNITY): Payer: Self-pay | Admitting: Emergency Medicine

## 2016-06-13 ENCOUNTER — Ambulatory Visit (HOSPITAL_COMMUNITY)
Admission: EM | Admit: 2016-06-13 | Discharge: 2016-06-13 | Disposition: A | Discharge status: Home / Self Care | Payer: BLUE CROSS/BLUE SHIELD | Attending: Emergency Medicine | Admitting: Emergency Medicine

## 2016-06-13 DIAGNOSIS — R6881 Early satiety: Secondary | ICD-10-CM | POA: Insufficient documentation

## 2016-06-13 DIAGNOSIS — R112 Nausea with vomiting, unspecified: Secondary | ICD-10-CM | POA: Diagnosis not present

## 2016-06-13 DIAGNOSIS — R1013 Epigastric pain: Secondary | ICD-10-CM | POA: Diagnosis present

## 2016-06-13 LAB — POCT URINALYSIS DIP (DEVICE)
BILIRUBIN URINE: NEGATIVE
GLUCOSE, UA: NEGATIVE mg/dL
Hgb urine dipstick: NEGATIVE
KETONES UR: NEGATIVE mg/dL
Nitrite: POSITIVE — AB
PROTEIN: NEGATIVE mg/dL
SPECIFIC GRAVITY, URINE: 1.02 (ref 1.005–1.030)
Urobilinogen, UA: 0.2 mg/dL (ref 0.0–1.0)
pH: 5.5 (ref 5.0–8.0)

## 2016-06-13 LAB — POCT PREGNANCY, URINE: PREG TEST UR: NEGATIVE

## 2016-06-13 MED ORDER — OMEPRAZOLE 20 MG PO CPDR: 20.0000 mg | DELAYED_RELEASE_CAPSULE | Freq: Every day | ORAL | 0 refills | Status: DC

## 2016-06-13 MED ORDER — RANITIDINE HCL 150 MG PO CAPS: 150.0000 mg | ORAL_CAPSULE | Freq: Two times a day (BID) | ORAL | 0 refills | Status: DC

## 2016-06-13 NOTE — Discharge Instructions (Signed)
Increase water intake.  No heavy meals and limit greasy and spicey foods.  May need t see a primary care provider if not improving.

## 2016-06-13 NOTE — ED Provider Notes (Signed)
CSN: [id number]     Arrival date & time 06/13/16  1112 History   First MD Initiated Contact with Patient 06/13/16 1158     Chief Complaint  Patient presents with  . Abdominal Pain   (Consider location/radiation/quality/duration/timing/severity/associated sxs/prior Treatment) Pleasant 25 year old female complaining of epigastric pain for 2 days. She states it is constant but worse in the morning upon awakening. Sometimes it is worse after eating. In the morning she has nausea and has vomited each day yesterday and today with a small amount of stomach contents. Eating sometimes makes it worse and she can only eat a small amount before she feels full. She denies constipation or diarrhea. She states her bowel movements are normal and daily. She denies lower abdominal pain, pelvic pain. She denies seeing blood in her stool or emesis. LMP 05/15/2016.      Past Medical History:  Diagnosis Date  . Late prenatal care   . Medical history non-contributory   . No pertinent past medical history    Past Surgical History:  Procedure Laterality Date  . NO PAST SURGERIES     No family history on file. Social History  Substance Use Topics  . Smoking status: Never Smoker  . Smokeless tobacco: Never Used  . Alcohol use No   OB History    Gravida Para Term Preterm AB Living   5 3 3  0 1 3   SAB TAB Ectopic Multiple Live Births   1 0 0 0 3     Review of Systems  Constitutional: Negative.   HENT: Negative.   Respiratory: Negative.  Negative for cough and shortness of breath.   Cardiovascular: Negative for chest pain and leg swelling.  Gastrointestinal: Positive for abdominal pain, nausea and vomiting. Negative for abdominal distention, blood in stool, constipation and diarrhea.  Genitourinary: Negative.   Musculoskeletal: Negative.   Skin: Negative.   All other systems reviewed and are negative.   Allergies  Chicken allergy  Home Medications   Prior to Admission medications    Medication Sig Start Date End Date Taking? Authorizing Provider  Prenatal Vit-Fe Fumarate-FA (PRENATAL MULTIVITAMIN) TABS Take 1 tablet by mouth daily.    Historical Provider, MD   Meds Ordered and Administered this Visit  Medications - No data to display  BP 108/75 (BP Location: Right Arm)   Pulse 71   Temp 97.8 F (36.6 C) (Oral)   Resp 16   LMP 05/15/2016   SpO2 99%  No data found.   Physical Exam  Constitutional: She is oriented to person, place, and time. She appears well-developed and well-nourished. No distress.  Eyes: EOM are normal.  Neck: Normal range of motion. Neck supple.  Cardiovascular: Normal rate, regular rhythm, normal heart sounds and intact distal pulses.   Pulmonary/Chest: Effort normal and breath sounds normal. No respiratory distress. She has no wheezes.  Abdominal: Soft. Bowel sounds are normal. She exhibits no distension and no mass. There is no rebound and no guarding. No hernia.  Percussion reveals tympany in the epigastrium and upper abdomen. Dullness in the lower half of the abdomen. Mild tenderness in the epigastrium. No other areas of tenderness. No rebound or guarding. No tenderness over the mid suprapubic area.  Musculoskeletal: She exhibits no edema.  Neurological: She is alert and oriented to person, place, and time. She exhibits normal muscle tone.  Skin: Skin is warm and dry.  Psychiatric: She has a normal mood and affect.  Nursing note and vitals reviewed.   Urgent Care  Course   Clinical Course    Procedures (including critical care time)  Labs Review Labs Reviewed  POCT URINALYSIS DIP (DEVICE) - Abnormal; Notable for the following:       Result Value   Nitrite POSITIVE (*)    Leukocytes, UA TRACE (*)    All other components within normal limits  URINE CULTURE  POCT PREGNANCY, URINE   Results for orders placed or performed during the hospital encounter of 06/13/16  POCT urinalysis dip (device)  Result Value Ref Range    Glucose, UA NEGATIVE NEGATIVE mg/dL   Bilirubin Urine NEGATIVE NEGATIVE   Ketones, ur NEGATIVE NEGATIVE mg/dL   Specific Gravity, Urine 1.020 1.005 - 1.030   Hgb urine dipstick NEGATIVE NEGATIVE   pH 5.5 5.0 - 8.0   Protein, ur NEGATIVE NEGATIVE mg/dL   Urobilinogen, UA 0.2 0.0 - 1.0 mg/dL   Nitrite POSITIVE (A) NEGATIVE   Leukocytes, UA TRACE (A) NEGATIVE  Pregnancy, urine POC  Result Value Ref Range   Preg Test, Ur NEGATIVE NEGATIVE     Imaging Review No results found.   Visual Acuity Review  Right Eye Distance:   Left Eye Distance:   Bilateral Distance:    Right Eye Near:   Left Eye Near:    Bilateral Near:         MDM   1. Epigastric pain   2. Early satiety   3. Non-intractable vomiting with nausea, vomiting of unspecified type    Increase water intake.  No heavy meals and limit greasy and spicey foods.  May need t see a primary care provider if not improving. Meds ordered this encounter  Medications  . omeprazole (PRILOSEC) 20 MG capsule    Sig: Take 1 capsule (20 mg total) by mouth daily.    Dispense:  20 capsule    Refill:  0    Order Specific Question:   Supervising Provider    Answer:   Domenick GongMORTENSON, ASHLEY [[id number]]  . ranitidine (ZANTAC) 150 MG capsule    Sig: Take 1 capsule (150 mg total) by mouth 2 (two) times daily.    Dispense:  30 capsule    Refill:  0    Order Specific Question:   Supervising Provider    Answer:   Domenick GongMORTENSON, ASHLEY [[id number]]   Omeprazole canceled , Ranitidine only. Urine culture pending, no sx's, ntrtites present.     Hayden Rasmussenavid Erykah Lippert, NP 06/13/16 1239

## 2016-06-13 NOTE — ED Triage Notes (Signed)
Pt c/o constant epigastric pain onset x2 days  Reports pain is worse in the am or w/food  Sx also include n/v  LMP = 7/26.... LBM = yest w/no difficulty  A&O x4... NAD

## 2016-06-15 ENCOUNTER — Telehealth (HOSPITAL_COMMUNITY): Payer: Self-pay | Admitting: Internal Medicine

## 2016-06-15 LAB — URINE CULTURE: SPECIAL REQUESTS: NORMAL

## 2016-06-15 MED ORDER — CEPHALEXIN 500 MG PO CAPS: 500.0000 mg | ORAL_CAPSULE | Freq: Two times a day (BID) | ORAL | 0 refills | Status: AC

## 2016-06-15 NOTE — Telephone Encounter (Signed)
Clinical staff, please let patient know that urine culture was positive for E coli, suggesting a UTI could be contributing to abd pain and vomiting.   Rx for cephalexin sent to pharmacy of record, [facility] [address] at [place].  Recheck for further evaluation if abd pain/nausea/vomiting persist.  LM

## 2016-06-17 ENCOUNTER — Telehealth (HOSPITAL_COMMUNITY): Payer: Self-pay | Admitting: Emergency Medicine

## 2016-06-17 NOTE — Telephone Encounter (Signed)
-----   Message from Eustace MooreLaura W Murray, MD sent at 06/15/2016  6:40 PM EDT ----- Clinical staff, please let patient know that urine culture was positive for E coli, suggesting a UTI could be contributing to abd pain and vomiting.   Rx for cephalexin sent to pharmacy of record, [facility] [address] at [place] [address].  Recheck for further evaluation if abd pain/nausea/vomiting persist.  LM

## 2016-06-17 NOTE — Telephone Encounter (Signed)
Called [phone] but VM had not been set up yet.  Need to see how pt is doing and to give lab results from recent visit on 8/24 and notify her of pending Rx at pharmacy.  Also let pt know labs can be obtained from MyChart

## 2016-06-19 NOTE — Telephone Encounter (Signed)
Called [phone]... n/a  Need to see how pt is doing and to give lab results from recent visit on 8/24 and notify her of pending Rx at pharmacy.  Also let pt know labs can be obtained from MyChart

## 2016-09-16 ENCOUNTER — Encounter: Payer: Self-pay | Admitting: Medical

## 2016-09-16 ENCOUNTER — Inpatient Hospital Stay (HOSPITAL_COMMUNITY)
Admission: AD | Admit: 2016-09-16 | Discharge: 2016-09-18 | DRG: 781 | Disposition: A | Discharge status: Home / Self Care | Payer: BLUE CROSS/BLUE SHIELD | Source: Ambulatory Visit | Attending: Obstetrics and Gynecology | Admitting: Obstetrics and Gynecology

## 2016-09-16 ENCOUNTER — Inpatient Hospital Stay (HOSPITAL_COMMUNITY): Payer: BLUE CROSS/BLUE SHIELD

## 2016-09-16 DIAGNOSIS — O2301 Infections of kidney in pregnancy, first trimester: Secondary | ICD-10-CM | POA: Diagnosis present

## 2016-09-16 DIAGNOSIS — O0931 Supervision of pregnancy with insufficient antenatal care, first trimester: Secondary | ICD-10-CM | POA: Diagnosis not present

## 2016-09-16 DIAGNOSIS — Z3A09 9 weeks gestation of pregnancy: Secondary | ICD-10-CM | POA: Diagnosis not present

## 2016-09-16 DIAGNOSIS — R109 Unspecified abdominal pain: Secondary | ICD-10-CM

## 2016-09-16 DIAGNOSIS — N10 Acute tubulo-interstitial nephritis: Secondary | ICD-10-CM

## 2016-09-16 DIAGNOSIS — O26899 Other specified pregnancy related conditions, unspecified trimester: Secondary | ICD-10-CM

## 2016-09-16 LAB — URINALYSIS, ROUTINE W REFLEX MICROSCOPIC
Bilirubin Urine: NEGATIVE
Glucose, UA: NEGATIVE mg/dL
Hgb urine dipstick: NEGATIVE
KETONES UR: NEGATIVE mg/dL
NITRITE: POSITIVE — AB
PH: 7.5 (ref 5.0–8.0)
PROTEIN: NEGATIVE mg/dL
Specific Gravity, Urine: 1.015 (ref 1.005–1.030)

## 2016-09-16 LAB — CBC WITH DIFFERENTIAL/PLATELET
BASOS ABS: 0 10*3/uL (ref 0.0–0.1)
BASOS PCT: 0 %
EOS ABS: 0.2 10*3/uL (ref 0.0–0.7)
Eosinophils Relative: 1 %
HCT: 38.1 % (ref 36.0–46.0)
HEMOGLOBIN: 13.1 g/dL (ref 12.0–15.0)
LYMPHS ABS: 2.1 10*3/uL (ref 0.7–4.0)
Lymphocytes Relative: 10 %
MCH: 24.1 pg — ABNORMAL LOW (ref 26.0–34.0)
MCHC: 34.4 g/dL (ref 30.0–36.0)
MCV: 70.2 fL — ABNORMAL LOW (ref 78.0–100.0)
Monocytes Absolute: 0.9 10*3/uL (ref 0.1–1.0)
Monocytes Relative: 5 %
NEUTROS PCT: 84 %
Neutro Abs: 16.8 10*3/uL — ABNORMAL HIGH (ref 1.7–7.7)
Platelets: 325 10*3/uL (ref 150–400)
RBC: 5.43 MIL/uL — AB (ref 3.87–5.11)
RDW: 14.7 % (ref 11.5–15.5)
WBC: 20.1 10*3/uL — AB (ref 4.0–10.5)

## 2016-09-16 LAB — POCT PREGNANCY, URINE: Preg Test, Ur: POSITIVE — AB

## 2016-09-16 LAB — COMPREHENSIVE METABOLIC PANEL
ALBUMIN: 3.9 g/dL (ref 3.5–5.0)
ALK PHOS: 79 U/L (ref 38–126)
ALT: 30 U/L (ref 14–54)
AST: 22 U/L (ref 15–41)
Anion gap: 8 (ref 5–15)
BUN: 9 mg/dL (ref 6–20)
CALCIUM: 9.4 mg/dL (ref 8.9–10.3)
CO2: 25 mmol/L (ref 22–32)
CREATININE: 0.59 mg/dL (ref 0.44–1.00)
Chloride: 101 mmol/L (ref 101–111)
GFR calc Af Amer: >60 mL/min (ref >60)
GFR calc non Af Amer: >60 mL/min (ref >60)
GLUCOSE: 108 mg/dL — AB (ref 65–99)
Potassium: 4.3 mmol/L (ref 3.5–5.1)
SODIUM: 134 mmol/L — AB (ref 135–145)
Total Bilirubin: 0.5 mg/dL (ref 0.3–1.2)
Total Protein: 7.7 g/dL (ref 6.5–8.1)

## 2016-09-16 LAB — AMYLASE: Amylase: 88 U/L (ref 28–100)

## 2016-09-16 LAB — HCG, QUANTITATIVE, PREGNANCY: HCG, BETA CHAIN, QUANT, S: 152776 m[IU]/mL — AB (ref <5)

## 2016-09-16 LAB — URINE MICROSCOPIC-ADD ON: RBC / HPF: NONE SEEN RBC/hpf (ref 0–5)

## 2016-09-16 LAB — LIPASE, BLOOD: Lipase: 20 U/L (ref 11–51)

## 2016-09-16 MED ORDER — PRENATAL MULTIVITAMIN CH
1.0000 | ORAL_TABLET | Freq: Every day | ORAL | Status: DC
  Administered 2016-09-16 – 2016-09-17 (×2): 1 via ORAL
  Filled 2016-09-16 (×2): qty 1

## 2016-09-16 MED ORDER — SODIUM CHLORIDE 0.9 % IV SOLN
1.0000 g | Freq: Four times a day (QID) | INTRAVENOUS | Status: DC
  Administered 2016-09-16 – 2016-09-18 (×8): 1 g via INTRAVENOUS
  Filled 2016-09-16 (×9): qty 1000

## 2016-09-16 MED ORDER — ZOLPIDEM TARTRATE 5 MG PO TABS: 5.0000 mg | ORAL_TABLET | Freq: Every evening | ORAL | Status: DC | PRN

## 2016-09-16 MED ORDER — SODIUM CHLORIDE 0.9 % IV SOLN
INTRAVENOUS | Status: DC
  Administered 2016-09-16 – 2016-09-18 (×5): via INTRAVENOUS

## 2016-09-16 MED ORDER — GENTAMICIN SULFATE 40 MG/ML IJ SOLN
110.0000 mg | Freq: Three times a day (TID) | INTRAVENOUS | Status: DC
  Administered 2016-09-16 – 2016-09-18 (×6): 110 mg via INTRAVENOUS
  Filled 2016-09-16 (×7): qty 2.75

## 2016-09-16 MED ORDER — INFLUENZA VAC SPLIT QUAD 0.5 ML IM SUSY: 0.5000 mL | PREFILLED_SYRINGE | INTRAMUSCULAR | Status: DC

## 2016-09-16 MED ORDER — OXYCODONE-ACETAMINOPHEN 5-325 MG PO TABS: 1.0000 | ORAL_TABLET | ORAL | Status: DC | PRN

## 2016-09-16 MED ORDER — HYDROMORPHONE HCL 1 MG/ML IJ SOLN: 0.2000 mg | INTRAMUSCULAR | Status: DC | PRN

## 2016-09-16 MED ORDER — ACETAMINOPHEN 325 MG PO TABS
650.0000 mg | ORAL_TABLET | ORAL | Status: DC | PRN
  Administered 2016-09-16 – 2016-09-18 (×7): 650 mg via ORAL
  Filled 2016-09-16 (×7): qty 2

## 2016-09-16 NOTE — MAU Note (Signed)
Pt presents to MAU with complaints of fever since Saturday with pain in her upper abdomen. Denies any vaginal bleeding or abnormal discharge. LMP 07/14/16

## 2016-09-16 NOTE — Progress Notes (Signed)
Pharmacy Antibiotic Note  Rebecca Bryan is a 25 y.o. female admitted on 09/16/2016 with pyelonephritis.  Pharmacy has been consulted for Gentamicin dosing.  Plan: Gentamicin 110 mg IV Q 8hr Will monitor renal function and gentamicin levels as indicated  Height: 5\' 1"  (154.9 cm) Weight: 140 lb (63.5 kg) IBW/kg (Calculated) : 47.8 kg Dosing weight: 53 kg  Temp (24hrs), Avg:99.9 F (37.7 C), Min:99.6 F (37.6 C), Max:100.2 F (37.9 C)   Recent Labs Lab 09/16/16 1027  WBC 20.1*  CREATININE 0.59    Estimated Creatinine Clearance: 91.8 mL/min (by C-G formula based on SCr of 0.59 mg/dL).    Allergies  Allergen Reactions  . Chicken Allergy     Nose swells and itch    Antimicrobials this admission: Ampicillin 11/27 >>  Gentamicin 11/27 >>  Microbiology results: 11/27 UCx: pending   Thank you for allowing pharmacy to be a part of this patient's care.  Natasha BenceCline, [name] 09/16/2016 11:57 AM

## 2016-09-16 NOTE — MAU Provider Note (Signed)
History     CSN: [id number]  Arrival date and time: 09/16/16 [id number]   First Provider Initiated Contact with Patient 09/16/16 1126         Chief Complaint  Patient presents with  . Fever   HPI Rebecca Bryan is a 25 y.o. [id number] at 268w1d who presents to MAU today with complaint of flank pain, low back pain, upper abdominal pain and fever for the last 2 days. The patient has not taken anything for fever or pain. She denies lower abdominal pain, vaginal bleeding, discharge today. She has not yet started prenatal care for this pregnancy.   OB History    Gravida Para Term Preterm AB Living   6 3 3  0 1 3   SAB TAB Ectopic Multiple Live Births   1 0 0 0 3      Past Medical History:  Diagnosis Date  . Late prenatal care   . Medical history non-contributory   . No pertinent past medical history     Past Surgical History:  Procedure Laterality Date  . NO PAST SURGERIES      No family history on file.  Social History  Substance Use Topics  . Smoking status: Never Smoker  . Smokeless tobacco: Never Used  . Alcohol use No    Allergies:  Allergies  Allergen Reactions  . Chicken Allergy     Nose swells and itch    Prescriptions Prior to Admission  Medication Sig Dispense Refill Last Dose  . omeprazole (PRILOSEC) 20 MG capsule Take 1 capsule (20 mg total) by mouth daily. 20 capsule 0   . Prenatal Vit-Fe Fumarate-FA (PRENATAL MULTIVITAMIN) TABS Take 1 tablet by mouth daily.   Unknown at Unknown time  . ranitidine (ZANTAC) 150 MG capsule Take 1 capsule (150 mg total) by mouth 2 (two) times daily. 30 capsule 0     Review of Systems  Constitutional: Positive for fever. Negative for malaise/fatigue.  Gastrointestinal: Positive for abdominal pain. Negative for constipation, diarrhea, nausea and vomiting.  Genitourinary: Positive for flank pain. Negative for dysuria, frequency, hematuria and urgency.       Neg - vaginal bleeding  Musculoskeletal: Positive for back pain.    Physical Exam   Blood pressure 111/70, pulse 109, temperature 99.6 F (37.6 C), resp. rate 18, height 5\' 1"  (1.549 m), weight 140 lb (63.5 kg), last menstrual period 07/14/2016, SpO2 99 %, unknown if currently breastfeeding.  Physical Exam  Nursing note and vitals reviewed. Constitutional: She is oriented to person, place, and time. She appears well-developed and well-nourished. No distress.  HENT:  Head: Normocephalic and atraumatic.  Cardiovascular: Tachycardia present.   Respiratory: Effort normal.  GI: Soft. She exhibits no distension and no mass. There is no tenderness. There is CVA tenderness (mild, bilateral). There is no rebound and no guarding.  Neurological: She is alert and oriented to person, place, and time.  Skin: Skin is warm and dry. No erythema.  Psychiatric: She has a normal mood and affect.    Results for orders placed or performed during the hospital encounter of 09/16/16 (from the past 24 hour(s))  Urinalysis, Routine w reflex microscopic ([facility])     Status: Abnormal   Collection Time: 09/16/16 10:10 AM  Result Value Ref Range   Color, Urine YELLOW YELLOW   APPearance HAZY (A) CLEAR   Specific Gravity, Urine 1.015 1.005 - 1.030   pH 7.5 5.0 - 8.0   Glucose, UA NEGATIVE NEGATIVE mg/dL  Hgb urine dipstick NEGATIVE NEGATIVE   Bilirubin Urine NEGATIVE NEGATIVE   Ketones, ur NEGATIVE NEGATIVE mg/dL   Protein, ur NEGATIVE NEGATIVE mg/dL   Nitrite POSITIVE (A) NEGATIVE   Leukocytes, UA SMALL (A) NEGATIVE  Urine microscopic-add on     Status: Abnormal   Collection Time: 09/16/16 10:10 AM  Result Value Ref Range   Squamous Epithelial / LPF 0-5 (A) NONE SEEN   WBC, UA 0-5 0 - 5 WBC/hpf   RBC / HPF NONE SEEN 0 - 5 RBC/hpf   Bacteria, UA FEW (A) NONE SEEN  Pregnancy, urine POC     Status: Abnormal   Collection Time: 09/16/16 10:12 AM  Result Value Ref Range   Preg Test, Ur POSITIVE (A) NEGATIVE  CBC with Differential/Platelet     Status: Abnormal    Collection Time: 09/16/16 10:27 AM  Result Value Ref Range   WBC 20.1 (H) 4.0 - 10.5 K/uL   RBC 5.43 (H) 3.87 - 5.11 MIL/uL   Hemoglobin 13.1 12.0 - 15.0 g/dL   HCT 16.138.1 09.636.0 - 04.546.0 %   MCV 70.2 (L) 78.0 - 100.0 fL   MCH 24.1 (L) 26.0 - 34.0 pg   MCHC 34.4 30.0 - 36.0 g/dL   RDW 40.914.7 81.111.5 - 91.415.5 %   Platelets 325 150 - 400 K/uL   Neutrophils Relative % 84 %   Neutro Abs 16.8 (H) 1.7 - 7.7 K/uL   Lymphocytes Relative 10 %   Lymphs Abs 2.1 0.7 - 4.0 K/uL   Monocytes Relative 5 %   Monocytes Absolute 0.9 0.1 - 1.0 K/uL   Eosinophils Relative 1 %   Eosinophils Absolute 0.2 0.0 - 0.7 K/uL   Basophils Relative 0 %   Basophils Absolute 0.0 0.0 - 0.1 K/uL  Comprehensive metabolic panel     Status: Abnormal   Collection Time: 09/16/16 10:27 AM  Result Value Ref Range   Sodium 134 (L) 135 - 145 mmol/L   Potassium 4.3 3.5 - 5.1 mmol/L   Chloride 101 101 - 111 mmol/L   CO2 25 22 - 32 mmol/L   Glucose, Bld 108 (H) 65 - 99 mg/dL   BUN 9 6 - 20 mg/dL   Creatinine, Ser 7.820.59 0.44 - 1.00 mg/dL   Calcium 9.4 8.9 - 95.610.3 mg/dL   Total Protein 7.7 6.5 - 8.1 g/dL   Albumin 3.9 3.5 - 5.0 g/dL   AST 22 15 - 41 U/L   ALT 30 14 - 54 U/L   Alkaline Phosphatase 79 38 - 126 U/L   Total Bilirubin 0.5 0.3 - 1.2 mg/dL   GFR calc non Af Amer >60 >60 mL/min   GFR calc Af Amer >60 >60 mL/min   Anion gap 8 5 - 15  Amylase     Status: None   Collection Time: 09/16/16 10:27 AM  Result Value Ref Range   Amylase 88 28 - 100 U/L  Lipase, blood     Status: None   Collection Time: 09/16/16 10:27 AM  Result Value Ref Range   Lipase 20 11 - 51 U/L    MAU Course  Procedures None  MDM Discussed patient with Dr. Alysia PennaErvin. [facility] ([facility]mpicillin and Gentamycin). Order first trimester pelvic US to confirm pregnancy and dating. Urine culture ordered.   Assessment and Plan  A: Positive pregnancy test Pyelonephritis  P: Admit to Women's Unit for IV fluids and antibiotics   Rebecca LowensteinJulie N  Malala Trenkamp, PA-C  09/16/2016, 11:26 AM

## 2016-09-16 NOTE — H&P (Signed)
FACULTY PRACTICE ANTEPARTUM ADMISSION HISTORY AND PHYSICAL NOTE   History of Present Illness: Rebecca Bryan is a 25 y.o. [id number] at 1165w1d admitted for pyelonephritis. She has noted subjective fever and chills with associated back since Saturday. Presented to MAU for further evaluation. Note to have fever of 10.2, elevated WBC, infected appearing UA and exam consistent with pyelonephritis. Pt admitted for IV antibiotic treatment.  She has not started prenatal care but has made an appt with the [facility]. She denies any Vb.    Patient Active Problem List   Diagnosis Date Noted  . Pyelonephritis affecting pregnancy in first trimester 09/16/2016    Past Medical History:  Diagnosis Date  . Late prenatal care   . Medical history non-contributory   . No pertinent past medical history     Past Surgical History:  Procedure Laterality Date  . NO PAST SURGERIES      OB History  Gravida Para Term Preterm AB Living  6 3 3  0 1 3  SAB TAB Ectopic Multiple Live Births  1 0 0 0 3    # Outcome Date GA Lbr Len/2nd Weight Sex Delivery Anes PTL Lv  6 Current           5 Term 12/06/12 2914w1d 11:52 / 00:03 3.34 kg (7 lb 5.8 oz) F Vag-Spont Local  LIV  4 Term 2011 2348w0d 02:00 3.09 kg (6 lb 13 oz) F Vag-Spont   LIV  3 Term 2010 5332w0d 06:00 2.722 kg (6 lb) M Vag-Spont None  LIV  2 SAB 2009             Birth Comments: No complications  1 Gravida               Social History   Social History  . Marital status: Single    Spouse name: N/A  . Number of children: N/A  . Years of education: N/A   Social History Main Topics  . Smoking status: Never Smoker  . Smokeless tobacco: Never Used  . Alcohol use No  . Drug use: No  . Sexual activity: Not Currently    Birth control/ protection: None   Other Topics Concern  . Not on file   Social History Narrative  . No narrative on file    No family history on file.  No Known Allergies  Prescriptions Prior to Admission  Medication Sig Dispense  Refill Last Dose  . Prenatal Vit-Fe Fumarate-FA (PRENATAL MULTIVITAMIN) TABS Take 1 tablet by mouth daily.   09/16/2016 at Unknown time  . omeprazole (PRILOSEC) 20 MG capsule Take 1 capsule (20 mg total) by mouth daily. (Patient not taking: Reported on 09/16/2016) 20 capsule 0 Not Taking at Unknown time  . ranitidine (ZANTAC) 150 MG capsule Take 1 capsule (150 mg total) by mouth 2 (two) times daily. (Patient not taking: Reported on 09/16/2016) 30 capsule 0 Not Taking at Unknown time    Review of Systems - As per HPI  Vitals:  BP 103/62 (BP Location: Right Arm)   Pulse 81   Temp 99.7 F (37.6 C) (Oral)   Resp 16   Ht 5\' 1"  (1.549 m)   Wt 63.5 kg (140 lb)   LMP 07/14/2016   SpO2 98%   BMI 26.45 kg/m  Physical Examination: CONSTITUTIONAL: Well-developed, well-nourished female in no acute distress but ill appearing HENT:  Normocephalic, atraumatic, External right and left ear normal. Oropharynx is clear and moist EYES: Conjunctivae and EOM are normal. Pupils are equal, round,  and reactive to light. No scleral icterus.  NECK: Normal range of motion, supple, no masses SKIN: Skin is warm and dry. No rash noted. Not diaphoretic. No erythema. No pallor. NEUROLGIC: Alert and oriented to person, place, and time. Normal reflexes, muscle tone coordination. No cranial nerve deficit noted. PSYCHIATRIC: Normal mood and affect. Normal behavior. Normal judgment and thought content. CARDIOVASCULAR: Normal heart rate noted, regular rhythm RESPIRATORY: Effort and breath sounds normal, no problems with respiration noted ABDOMEN: Soft, nontender, nondistended, gravid. MUSCULOSKELETAL:  Bilateral CVA tenderness .Normal range of motion. No edema and no tenderness. 2+ distal pulses.  Cervix: Not evaluated. Fetal Monitoring:cardiac activity noted on U/S   Labs:  Results for orders placed or performed during the hospital encounter of 09/16/16 (from the past 24 hour(s))  Urinalysis, Routine w reflex  microscopic (not at [facility])   Collection Time: 09/16/16 10:10 AM  Result Value Ref Range   Color, Urine YELLOW YELLOW   APPearance HAZY (A) CLEAR   Specific Gravity, Urine 1.015 1.005 - 1.030   pH 7.5 5.0 - 8.0   Glucose, UA NEGATIVE NEGATIVE mg/dL   Hgb urine dipstick NEGATIVE NEGATIVE   Bilirubin Urine NEGATIVE NEGATIVE   Ketones, ur NEGATIVE NEGATIVE mg/dL   Protein, ur NEGATIVE NEGATIVE mg/dL   Nitrite POSITIVE (A) NEGATIVE   Leukocytes, UA SMALL (A) NEGATIVE  Urine microscopic-add on   Collection Time: 09/16/16 10:10 AM  Result Value Ref Range   Squamous Epithelial / LPF 0-5 (A) NONE SEEN   WBC, UA 0-5 0 - 5 WBC/hpf   RBC / HPF NONE SEEN 0 - 5 RBC/hpf   Bacteria, UA FEW (A) NONE SEEN  Pregnancy, urine POC   Collection Time: 09/16/16 10:12 AM  Result Value Ref Range   Preg Test, Ur POSITIVE (A) NEGATIVE  CBC with Differential/Platelet   Collection Time: 09/16/16 10:27 AM  Result Value Ref Range   WBC 20.1 (H) 4.0 - 10.5 K/uL   RBC 5.43 (H) 3.87 - 5.11 MIL/uL   Hemoglobin 13.1 12.0 - 15.0 g/dL   HCT 16.1 09.6 - 04.5 %   MCV 70.2 (L) 78.0 - 100.0 fL   MCH 24.1 (L) 26.0 - 34.0 pg   MCHC 34.4 30.0 - 36.0 g/dL   RDW 40.9 81.1 - 91.4 %   Platelets 325 150 - 400 K/uL   Neutrophils Relative % 84 %   Neutro Abs 16.8 (H) 1.7 - 7.7 K/uL   Lymphocytes Relative 10 %   Lymphs Abs 2.1 0.7 - 4.0 K/uL   Monocytes Relative 5 %   Monocytes Absolute 0.9 0.1 - 1.0 K/uL   Eosinophils Relative 1 %   Eosinophils Absolute 0.2 0.0 - 0.7 K/uL   Basophils Relative 0 %   Basophils Absolute 0.0 0.0 - 0.1 K/uL  Comprehensive metabolic panel   Collection Time: 09/16/16 10:27 AM  Result Value Ref Range   Sodium 134 (L) 135 - 145 mmol/L   Potassium 4.3 3.5 - 5.1 mmol/L   Chloride 101 101 - 111 mmol/L   CO2 25 22 - 32 mmol/L   Glucose, Bld 108 (H) 65 - 99 mg/dL   BUN 9 6 - 20 mg/dL   Creatinine, Ser 7.82 0.44 - 1.00 mg/dL   Calcium 9.4 8.9 - 95.6 mg/dL   Total Protein 7.7 6.5 - 8.1 g/dL    Albumin 3.9 3.5 - 5.0 g/dL   AST 22 15 - 41 U/L   ALT 30 14 - 54 U/L   Alkaline Phosphatase 79  38 - 126 U/L   Total Bilirubin 0.5 0.3 - 1.2 mg/dL   GFR calc non Af Amer >60 >60 mL/min   GFR calc Af Amer >60 >60 mL/min   Anion gap 8 5 - 15  Amylase   Collection Time: 09/16/16 10:27 AM  Result Value Ref Range   Amylase 88 28 - 100 U/L  Lipase, blood   Collection Time: 09/16/16 10:27 AM  Result Value Ref Range   Lipase 20 11 - 51 U/L  hCG, quantitative, pregnancy   Collection Time: 09/16/16 10:27 AM  Result Value Ref Range   hCG, Beta Chain, Quant, S 152,776 (H) <5 mIU/mL    Imaging Studies: [place] Ob Comp Less 14 Wks  Result Date: 09/16/2016 CLINICAL DATA:  Fever and abdominal pain. Confirm pregnancy. Flank pain. LMP was09/24/2017. Gestational age by LMP is9 weeks 1 day. EDC by LMP is07/10/2016. EXAM: OBSTETRIC <14 WK ULTRASOUND TECHNIQUE: Transabdominal ultrasound was performed for evaluation of the gestation as well as the maternal uterus and adnexal regions. COMPARISON:  None applicable FINDINGS: Intrauterine gestational sac: Present Yolk sac:  Present Embryo:  Present Cardiac Activity: Present Heart Rate: 180 bpm CRL:   30.3  mm   9 w 6 d                  US EDC: 04/15/2017 Subchorionic hemorrhage:  None visualized. Maternal uterus/adnexae: Ovaries have a normal appearance. No free pelvic fluid. IMPRESSION: 1. Single living intrauterine embryo. 2. Size by today's exam confirms clinical EDC of 04/20/2017. Electronically Signed   By: Norva PavlovElizabeth  Brown M.D.   On: 09/16/2016 14:01     Assessment and Plan: IUP 9 1/7 weeks Pyelonephritis  Admit for IV antibiotics and hydration. POC discussed with pt and husband. Agree with POC.  Ames Hoban L. Alysia PennaErvin, MD, FACOG Attending Obstetrician & Gynecologist Faculty Practice, [facility]

## 2016-09-17 ENCOUNTER — Encounter (HOSPITAL_COMMUNITY): Payer: Self-pay | Admitting: *Deleted

## 2016-09-17 LAB — CULTURE, OB URINE

## 2016-09-17 NOTE — Progress Notes (Signed)
ACULTY PRACTICE ANTEPARTUM COMPREHENSIVE PROGRESS NOTE  Rebecca Bryan is a 25 y.o. [id number] at 4672w2d  who is admitted for pyelonephritis..    Length of Stay:  1  Days  Subjective: Pt reports feeling better this morning.   Vitals:  Blood pressure 105/63, pulse 88, temperature 99.5 F (37.5 C), temperature source Oral, resp. rate 16, height 5\' 1"  (1.549 m), weight 63.5 kg (140 lb), last menstrual period 07/14/2016, SpO2 100 %, unknown if currently breastfeeding. Physical Examination: AF VSS Lungs clear  Heart RRR Abd soft + BS Back no  CVA tenderness  Fetal Monitoring:  Labs:  Results for orders placed or performed during the hospital encounter of 09/16/16 (from the past 24 hour(s))  Urinalysis, Routine w reflex microscopic ([facility])   Collection Time: 09/16/16 10:10 AM  Result Value Ref Range   Color, Urine YELLOW YELLOW   APPearance HAZY (A) CLEAR   Specific Gravity, Urine 1.015 1.005 - 1.030   pH 7.5 5.0 - 8.0   Glucose, UA NEGATIVE NEGATIVE mg/dL   Hgb urine dipstick NEGATIVE NEGATIVE   Bilirubin Urine NEGATIVE NEGATIVE   Ketones, ur NEGATIVE NEGATIVE mg/dL   Protein, ur NEGATIVE NEGATIVE mg/dL   Nitrite POSITIVE (A) NEGATIVE   Leukocytes, UA SMALL (A) NEGATIVE  Urine microscopic-add on   Collection Time: 09/16/16 10:10 AM  Result Value Ref Range   Squamous Epithelial / LPF 0-5 (A) NONE SEEN   WBC, UA 0-5 0 - 5 WBC/hpf   RBC / HPF NONE SEEN 0 - 5 RBC/hpf   Bacteria, UA FEW (A) NONE SEEN  Pregnancy, urine POC   Collection Time: 09/16/16 10:12 AM  Result Value Ref Range   Preg Test, Ur POSITIVE (A) NEGATIVE  CBC with Differential/Platelet   Collection Time: 09/16/16 10:27 AM  Result Value Ref Range   WBC 20.1 (H) 4.0 - 10.5 K/uL   RBC 5.43 (H) 3.87 - 5.11 MIL/uL   Hemoglobin 13.1 12.0 - 15.0 g/dL   HCT 45.438.1 09.836.0 - 11.946.0 %   MCV 70.2 (L) 78.0 - 100.0 fL   MCH 24.1 (L) 26.0 - 34.0 pg   MCHC 34.4 30.0 - 36.0 g/dL   RDW 14.714.7 82.911.5 - 56.215.5 %   Platelets 325 150 - 400  K/uL   Neutrophils Relative % 84 %   Neutro Abs 16.8 (H) 1.7 - 7.7 K/uL   Lymphocytes Relative 10 %   Lymphs Abs 2.1 0.7 - 4.0 K/uL   Monocytes Relative 5 %   Monocytes Absolute 0.9 0.1 - 1.0 K/uL   Eosinophils Relative 1 %   Eosinophils Absolute 0.2 0.0 - 0.7 K/uL   Basophils Relative 0 %   Basophils Absolute 0.0 0.0 - 0.1 K/uL  Comprehensive metabolic panel   Collection Time: 09/16/16 10:27 AM  Result Value Ref Range   Sodium 134 (L) 135 - 145 mmol/L   Potassium 4.3 3.5 - 5.1 mmol/L   Chloride 101 101 - 111 mmol/L   CO2 25 22 - 32 mmol/L   Glucose, Bld 108 (H) 65 - 99 mg/dL   BUN 9 6 - 20 mg/dL   Creatinine, Ser 1.300.59 0.44 - 1.00 mg/dL   Calcium 9.4 8.9 - 86.510.3 mg/dL   Total Protein 7.7 6.5 - 8.1 g/dL   Albumin 3.9 3.5 - 5.0 g/dL   AST 22 15 - 41 U/L   ALT 30 14 - 54 U/L   Alkaline Phosphatase 79 38 - 126 U/L   Total Bilirubin 0.5 0.3 -  1.2 mg/dL   GFR calc non Af Amer >60 >60 mL/min   GFR calc Af Amer >60 >60 mL/min   Anion gap 8 5 - 15  Amylase   Collection Time: 09/16/16 10:27 AM  Result Value Ref Range   Amylase 88 28 - 100 U/L  Lipase, blood   Collection Time: 09/16/16 10:27 AM  Result Value Ref Range   Lipase 20 11 - 51 U/L  hCG, quantitative, pregnancy   Collection Time: 09/16/16 10:27 AM  Result Value Ref Range   hCG, Beta Chain, Quant, S 152,776 (H) <5 mIU/mL    Imaging Studies:       Medications:  Scheduled . ampicillin (OMNIPEN) IV  1 g Intravenous Q6H  . gentamicin  110 mg Intravenous Q8H  . Influenza vac split quadrivalent PF  0.5 mL Intramuscular Tomorrow-1000  . prenatal multivitamin  1 tablet Oral Q1200   I have reviewed the patient's current medications.  ASSESSMENT:      IUP 9 2/7 weeks  Pyelonephritis  PLAN: Improving. Continue with present management. Repeat CBC in Am. Wait for UC   Win Guajardo L Arpi Diebold 09/17/2016,6:10 AM

## 2016-09-18 DIAGNOSIS — O2301 Infections of kidney in pregnancy, first trimester: Principal | ICD-10-CM

## 2016-09-18 LAB — CBC WITH DIFFERENTIAL/PLATELET
BASOS PCT: 0 %
Basophils Absolute: 0 10*3/uL (ref 0.0–0.1)
EOS ABS: 0.4 10*3/uL (ref 0.0–0.7)
EOS PCT: 3 %
HEMATOCRIT: 32.9 % — AB (ref 36.0–46.0)
Hemoglobin: 11.1 g/dL — ABNORMAL LOW (ref 12.0–15.0)
Lymphocytes Relative: 20 %
Lymphs Abs: 2.9 10*3/uL (ref 0.7–4.0)
MCH: 23.6 pg — ABNORMAL LOW (ref 26.0–34.0)
MCHC: 33.7 g/dL (ref 30.0–36.0)
MCV: 69.9 fL — ABNORMAL LOW (ref 78.0–100.0)
MONO ABS: 0.7 10*3/uL (ref 0.1–1.0)
MONOS PCT: 5 %
Neutro Abs: 10.7 10*3/uL — ABNORMAL HIGH (ref 1.7–7.7)
Neutrophils Relative %: 73 %
Platelets: 296 10*3/uL (ref 150–400)
RBC: 4.71 MIL/uL (ref 3.87–5.11)
RDW: 14.8 % (ref 11.5–15.5)
WBC: 14.7 10*3/uL — ABNORMAL HIGH (ref 4.0–10.5)

## 2016-09-18 MED ORDER — BUTALBITAL-APAP-CAFFEINE 50-325-40 MG PO TABS: 1.0000 | ORAL_TABLET | Freq: Four times a day (QID) | ORAL | 0 refills | Status: DC | PRN

## 2016-09-18 MED ORDER — CEPHALEXIN 500 MG PO CAPS: 500.0000 mg | ORAL_CAPSULE | Freq: Four times a day (QID) | ORAL | 2 refills | Status: DC

## 2016-09-18 NOTE — Discharge Summary (Signed)
Physician Discharge Summary  Patient ID: Rebecca Bryan MRN: [id number] DOB/AGE: 25-Feb-1991 25 y.o.  Admit date: 09/16/2016 Discharge date: 09/18/2016  Admission Diagnoses: Patient Active Problem List   Diagnosis Date Noted  . Pyelonephritis affecting pregnancy in first trimester 09/16/2016     Discharge Diagnoses:  Active Problems:   Pyelonephritis affecting pregnancy in first trimester   Discharged Condition: good  Hospital Course: Rebecca FareLathi H Goldie is a 25 y.o. [id number] at 10135w1d admitted for pyelonephritis. She has noted subjective fever and chills with associated back since Saturday. Presented to MAU for further evaluation. Note to have fever of 10.2, elevated WBC, infected appearing UA and exam consistent with pyelonephritis. Pt admitted for IV antibiotic treatment. She improved and was afebrile with no flank pain at the time of discharge  Consults: None  Significant Diagnostic Studies: microbiology: urine culture: positive for mixed flora  Treatments: IV hydration and antibiotics: gentamycin and ampicillin  Discharge Exam: Blood pressure 98/61, pulse 79, temperature 98.3 F (36.8 C), temperature source Oral, resp. rate 16, height 5\' 1"  (1.549 m), weight 63.5 kg (140 lb), last menstrual period 07/14/2016, SpO2 100 %, unknown if currently breastfeeding. General appearance: alert, cooperative and no distress GI: no CVAT  Disposition: 01-Home or Self Care     Medication List    STOP taking these medications   ranitidine 150 MG capsule Commonly known as:  ZANTAC     TAKE these medications   butalbital-acetaminophen-caffeine 50-325-40 MG tablet Commonly known as:  FIORICET, ESGIC Take 1-2 tablets by mouth every 6 (six) hours as needed for headache.   cephALEXin 500 MG capsule Commonly known as:  KEFLEX Take 1 capsule (500 mg total) by mouth 4 (four) times daily.   omeprazole 20 MG capsule Commonly known as:  PRILOSEC Take 1 capsule (20 mg total) by mouth daily.    prenatal multivitamin Tabs tablet Take 1 tablet by mouth daily.      Follow-up Information    [facility]   Contact information: [address] [place] [id number] [phone]           Signed: Scheryl DarterRNOLD,Jaeshaun Riva 09/18/2016, 7:32 AM

## 2016-09-18 NOTE — Progress Notes (Signed)
Patient discharged home with family. Discharge paperwork, instructions, follow-up appts, and prescriptions reviewed. Paperwork provided in [place] and [place]. No questions at this time.

## 2016-09-18 NOTE — Discharge Instructions (Signed)
Vim b? th?n, Ng??i l?n (Pyelonephritis, Adult) Vim b? th?n l m?t b?nh nhi?m trng th?n. Th?n l c? quan l?c mu c?a m?t ng??i v ??a ch?t th?i ra kh?i mu vo n??c ti?u. N??c ti?u ?i t? th?n, qua ni?u qu?n v vo bng quang. C hai lo?i vim b? th?n chnh:  Nhi?m trng xu?t hi?n nhanh chng m khng c b?t c? c?nh bo no (vim b? th?n c?p tnh).  Nhi?m trng ko di trong m?t th?i gian di (vim b? th?n m?n tnh). Trong h?u h?t cc tr??ng h?p, nhi?m trng s? kh?i khi ???c ?i?u tr? v khng gy thm v?n ??. Nh?ng nhi?m trng n?ng h?n ho?c nhi?m trng m?n tnh ?i khi c th? lan truy?n vo mu ho?c d?n ??n m?t s? v?n ?? v?i th?n. NGUYN NHN Tnh tr?ng ny th??ng do:  Vi khu?n di chuy?n t? bng quang ??n th?n thng qua n??c ti?u b? nhi?m khu?n. N??c ti?u trong bng quang c th? b? nhi?m vi khu?n t?:  Nhi?m trng bng quang (vim bng quang).  Vim tuy?n ti?n li?t (b?nh vim tuy?n ti?n li?t).  Giao h?p, ? n? gi?i.  Vi khu?n di chuy?n t? mu vo th?n. CC Y?U T? NGUY C? Tnh tr?ng ny hay x?y ra h?n ?:  Ph? n? c [place].  Ng??i gi.  Ng??i b? ti?u ???ng.  Ng??i b? s?i th?n ho?c s?i bng quang.  Ng??i c cc b?t th??ng ? th?n ho?c ni?u qu?n.  Ng??i c ??t ?ng thng ? bng quang.  Ng??i b? ung th?.  Ng??i c quan h? tnh d?c.  Ph? n? s? d?ng ch?t di?t tinh trng.  Ng??i tr??c ?y d t?ng b? nhi?m trng ???ng ti?u. TRI?U CH?NG Nh?ng tri?u ch?ng c?a tnh tr?ng ny bao g?m:  Th??ng xuyn ?i ti?u.  R?t mu?n ho?c lin t?c mu?n ?i ti?u.  Nng rt ho?c ?au nh?c khi ?i ti?u.  ?au b?ng.  ?au l?ng.  ?au ? c?nh ho?c m?ng s??n.  S?t.  ?n l?nh.  ?i ti?u ra mu ho?c n??c ti?u s?m m?u.  Bu?n nn.  Nn. CH?N ?ON B?nh ny c th? ???c ch?n ?on d?a vo:  Khai thc ti?n s? v khm th?c th?.  Xt nghi?m n??c ti?u.  Xt nghi?m mu. Qu v? c?ng c th? c cc ki?m tra hnh ?nh c?a th?n khc, ch?ng h?n nh? siu m ho?c ch?p CT. ?I?U TR? Vi?c ?i?u tr? b?nh ny c th?  ty thu?c vo m?c ?? n?ng c?a nhi?m trng.  N?u nhi?m trng l nh? v ???c pht hi?n s?m, qu v? c th? ???c ?i?u tr? b?ng cch u?ng thu?c khng sinh. Qu v? s? c?n u?ng n??c ?? c? th? khng b? m?t n??c.  N?u nhi?m trng n?ng h?n, qu v? c th? c?n n?m vi?n v ???c dng khng sinh tr?c ti?p vo t?nh m?ch qua m?t ?ng IV. Qu v? c th? c?ng c?n ???c truy?n ch?t l?ng thng qua m?t ?ng IV n?u qu v? khng th? gi? cho c? th? ?? n??c. Sau khi n?m vi?n, qu v? c th? c?n u?ng thu?c khng sinh trong m?t th?i gian. Nh?ng bi?n php ?i?u tr? khc c th? c?n, ty thu?c vo nguyn nhn gy nhi?m trng. H??NG D?N CH?M [place] T?I NH Thu?c   Ch? s? d?ng thu?c khng c?n k ??n v thu?c c?n k ??n theo ch? d?n c?a chuyn gia ch?m [place] s?c kh?e.  N?u qu v? ???c k thu?c khng sinh, hy dng thu?c theo ch? d?n  c?a chuyn gia ch?m [place] s?c kh?e. Khng d?ng u?ng thu?c khng sinh ngay c? khi qu v? b?t ??u c?m th?y ?? h?n. H??ng d?n chung   U?ng ?? n??c ?? gi? cho n??c ti?u trong ho?c c mu vng nh?t.  Trnh caffein, tr v cc ?? u?ng c ga. Nh?ng ?? u?ng ny c xu h??ng gy kch thch bng quang.  ?i ti?u th??ng xuyn. Trnh nh?n ?i ti?u trong th?i gian di.  ?i ti?u tr??c v sau khi quan h? tnh d?c.  Sau khi ??i ti?n, n? gi?i c?n v? sinh t? tr??c ra sau. Ch? dng m?t kh?n gi?y m?i l?n.  Tun th? t?t c? cc cu?c h?n khm l?i theo ch? d?n c?a chuyn gia ch?m [place] s?c kh?e. ?i?u ny c vai tr quan tr?ng. ?I KHM N?U:  Cc tri?u ch?ng c?a qu v? khng ?? h?n sau 2 ngy ?i?u tr?.  Tri?u ch?ng c?a qu v? tr?m tr?ng h?n.  Qu v? b? s?t. NGAY L?P T?C ?I KHM N?U:  Qu v? khng th? dng khng sinh ho?c ch?t l?ng.  Qu v? b? ?n l?nh rng mnh.  Qu v? nnLadell Heads.  Qu v? b? ?au r?t nhi?u ? m?ng s??n ho?c ? l?ng.  Qu v? r?t y?u ho?c b? ng?t x?u. Thng tin ny khng nh?m m?c ?ch thay th? cho l?i khuyn m chuyn gia ch?m [place] s?c kh?e ni v?i qu v?. Hy b?o ??m qu v? ph?i th?o lu?n b?t k? v?n ?? g m qu v?  c v?i chuyn gia ch?m [place] s?c kh?e c?a qu v?. Document Released: 10/07/2005 Document Revised: 06/28/2015 Document Reviewed: 01/30/2015 Elsevier Interactive Patient Education  2017 [employer]

## 2016-10-21 LAB — OB RESULTS CONSOLE RPR: RPR: NONREACTIVE

## 2016-10-21 NOTE — L&D Delivery Note (Signed)
26 y.o. [id number] at 8049w6d delivered a viable female infant. Arrived in room with nurse delivering baby. 60 sec delayed cord clamping. Cord clamped x2 and cut. Placenta delivered spontaneously intact, with 3VC. Fundus firm on exam with massage and pitocin. Good hemostasis noted.  Anesthesia: IV pain meds Laceration: 1st degree perineal Suture: 2.0  Vicryl Good hemostasis noted. EBL: 150 cc  Mom and baby recovering in LDR.    Apgars: APGAR (1 MIN):  8 APGAR (5 MINS):  9 Weight: Pending skin to skin  Sponge and instrument count were correct x2. Placenta sent to L&D.  Howard PouchLauren Feng, MD PGY-1 Family Medicine 04/12/2017, 10:01 PM  OB FELLOW DELIVERY ATTESTATION  I was gloved and present for the delivery in its entirety, and I agree with the above resident's note.    Ernestina PennaNicholas Novi Calia, MD 10:22 PM

## 2016-10-28 ENCOUNTER — Other Ambulatory Visit (HOSPITAL_COMMUNITY): Payer: Self-pay | Admitting: Nurse Practitioner

## 2016-10-28 DIAGNOSIS — Z3A19 19 weeks gestation of pregnancy: Secondary | ICD-10-CM

## 2016-10-28 DIAGNOSIS — Z3689 Encounter for other specified antenatal screening: Secondary | ICD-10-CM

## 2016-10-28 DIAGNOSIS — Z363 Encounter for antenatal screening for malformations: Secondary | ICD-10-CM

## 2016-10-28 LAB — OB RESULTS CONSOLE HIV ANTIBODY (ROUTINE TESTING): HIV: NONREACTIVE

## 2016-11-07 LAB — OB RESULTS CONSOLE RUBELLA ANTIBODY, IGM: Rubella: IMMUNE

## 2016-11-07 LAB — OB RESULTS CONSOLE HEPATITIS B SURFACE ANTIGEN: Hepatitis B Surface Ag: NEGATIVE

## 2016-11-07 LAB — OB RESULTS CONSOLE HIV ANTIBODY (ROUTINE TESTING)

## 2016-11-11 ENCOUNTER — Encounter (HOSPITAL_COMMUNITY): Payer: Self-pay | Admitting: Nurse Practitioner

## 2016-11-21 ENCOUNTER — Ambulatory Visit (HOSPITAL_COMMUNITY)
Admission: RE | Admit: 2016-11-21 | Discharge: 2016-11-21 | Disposition: A | Discharge status: Home / Self Care | Payer: BLUE CROSS/BLUE SHIELD | Source: Ambulatory Visit | Attending: Nurse Practitioner | Admitting: Nurse Practitioner

## 2016-11-21 DIAGNOSIS — Z363 Encounter for antenatal screening for malformations: Secondary | ICD-10-CM | POA: Diagnosis present

## 2016-11-21 DIAGNOSIS — Z3A19 19 weeks gestation of pregnancy: Secondary | ICD-10-CM | POA: Insufficient documentation

## 2016-11-21 DIAGNOSIS — Z3689 Encounter for other specified antenatal screening: Secondary | ICD-10-CM

## 2017-02-27 ENCOUNTER — Inpatient Hospital Stay (HOSPITAL_COMMUNITY)
Admission: AD | Admit: 2017-02-27 | Discharge: 2017-02-27 | Disposition: A | Discharge status: Home / Self Care | Payer: BLUE CROSS/BLUE SHIELD | Source: Ambulatory Visit | Attending: Obstetrics & Gynecology | Admitting: Obstetrics & Gynecology

## 2017-02-27 ENCOUNTER — Encounter (HOSPITAL_COMMUNITY): Payer: Self-pay | Admitting: *Deleted

## 2017-02-27 DIAGNOSIS — R109 Unspecified abdominal pain: Secondary | ICD-10-CM | POA: Diagnosis not present

## 2017-02-27 DIAGNOSIS — O26899 Other specified pregnancy related conditions, unspecified trimester: Secondary | ICD-10-CM

## 2017-02-27 DIAGNOSIS — O368130 Decreased fetal movements, third trimester, not applicable or unspecified: Secondary | ICD-10-CM | POA: Diagnosis present

## 2017-02-27 DIAGNOSIS — Z3A32 32 weeks gestation of pregnancy: Secondary | ICD-10-CM | POA: Diagnosis not present

## 2017-02-27 DIAGNOSIS — R102 Pelvic and perineal pain: Secondary | ICD-10-CM

## 2017-02-27 DIAGNOSIS — O4703 False labor before 37 completed weeks of gestation, third trimester: Secondary | ICD-10-CM

## 2017-02-27 LAB — URINALYSIS, ROUTINE W REFLEX MICROSCOPIC
BILIRUBIN URINE: NEGATIVE
GLUCOSE, UA: NEGATIVE mg/dL
Hgb urine dipstick: NEGATIVE
KETONES UR: NEGATIVE mg/dL
Nitrite: NEGATIVE
PROTEIN: NEGATIVE mg/dL
Specific Gravity, Urine: 1.009 (ref 1.005–1.030)
pH: 6 (ref 5.0–8.0)

## 2017-02-27 LAB — WET PREP, GENITAL
CLUE CELLS WET PREP: NONE SEEN
Sperm: NONE SEEN
TRICH WET PREP: NONE SEEN
Yeast Wet Prep HPF POC: NONE SEEN

## 2017-02-27 NOTE — Discharge Instructions (Signed)
Abdominal Pain During Pregnancy °Abdominal pain is common in pregnancy. Most of the time, it does not cause harm. There are many causes of abdominal pain. Some causes are more serious than others and sometimes the cause is not known. Abdominal pain can be a sign that something is very wrong with the pregnancy or the pain may have nothing to do with the pregnancy. Always tell your health care provider if you have any abdominal pain. °Follow these instructions at home: °· Do not have sex or put anything in your vagina until your symptoms go away completely. °· Watch your abdominal pain for any changes. °· Get plenty of rest until your pain improves. °· Drink enough fluid to keep your urine clear or pale yellow. °· Take over-the-counter or prescription medicines only as told by your health care provider. °· Keep all follow-up visits as told by your health care provider. This is important. °Contact a health care provider if: °· You have a fever. °· Your pain gets worse or you have cramping. °· Your pain continues after resting. °Get help right away if: °· You are bleeding, leaking fluid, or passing tissue from the vagina. °· You have vomiting or diarrhea that does not go away. °· You have painful or bloody urination. °· You notice a decrease in your baby's movements. °· You feel very weak or faint. °· You have shortness of breath. °· You develop a severe headache with abdominal pain. °· You have abnormal vaginal discharge with abdominal pain. °This information is not intended to replace advice given to you by your health care provider. Make sure you discuss any questions you have with your health care provider. °Document Released: 10/07/2005 Document Revised: 07/18/2016 Document Reviewed: 05/06/2013 °Elsevier Interactive Patient Education © 2017 Elsevier Inc. ° °Round Ligament Pain During Pregnancy  ° °Round ligament pain is a sharp pain or jabbing feeling often felt in the lower belly or groin area on one or both  sides. It is one of the most common complaints during pregnancy and is considered a normal part of pregnancy. It is most often felt during the second trimester.  ° °Here is what you need to know about round ligament pain, including some tips to help you feel better.  ° °Causes of Round Ligament Pain  ° °Several thick ligaments surround and support your womb (uterus) as it grows during pregnancy. One of them is called the round ligament.  ° °The round ligament connects the front part of the womb to your groin, the area where your legs attach to your pelvis. The round ligament normally tightens and relaxes slowly.  ° °As your baby and womb grow, the round ligament stretches. That makes it more likely to become strained.  ° °Sudden movements can cause the ligament to tighten quickly, like a rubber band snapping. This causes a sudden and quick jabbing feeling.  ° °Symptoms of Round Ligament Pain  ° °Round ligament pain can be concerning and uncomfortable. But it is considered normal as your body changes during pregnancy.  ° °The symptoms of round ligament pain include a sharp, sudden spasm in the belly. It usually affects the right side, but it may happen on both sides. The pain only lasts a few seconds.  ° °Exercise may cause the pain, as will rapid movements such as:  °sneezing  °coughing  °laughing  °rolling over in bed  °standing up too quickly  ° °Treatment of Round Ligament Pain  ° °Here are some tips that may   help reduce your discomfort:  ° °Pain relief. Take over-the-counter acetaminophen for pain, if necessary. Ask your doctor if this is OK.  ° °Exercise. Get plenty of exercise to keep your stomach (core) muscles strong. Doing stretching exercises or prenatal yoga can be helpful. Ask your doctor which exercises are safe for you and your baby.  ° °A helpful exercise involves putting your hands and knees on the floor, lowering your head, and pushing your backside into the air.  ° °Avoid sudden movements. Change  positions slowly (such as standing up or sitting down) to avoid sudden movements that may cause stretching and pain.  ° °Flex your hips. Bend and flex your hips before you cough, sneeze, or laugh to avoid pulling on the ligaments.  ° °Apply warmth. A heating pad or warm bath may be helpful. Ask your doctor if this is OK. Extreme heat can be dangerous to the baby.  ° °You should try to modify your daily activity level and avoid positions that may worsen the condition.  ° °When to Call the Doctor/Midwife  ° °Always tell your doctor or midwife about any type of pain you have during pregnancy. Round ligament pain is quick and doesn't last long.  ° °Call your health care provider immediately if you have:  °severe pain  °fever  °chills  °pain on urination  °difficulty walking  ° °Belly pain during pregnancy can be due to many different causes. It is important for your doctor to rule out more serious conditions, including pregnancy complications such as placenta abruption or non-pregnancy illnesses such as:  °inguinal hernia  °appendicitis  °stomach, liver, and kidney problems  °Preterm labor pains may sometimes be mistaken for round ligament pain.   °  ° °

## 2017-02-27 NOTE — MAU Note (Signed)
Pt has not felt baby move since 0100. Pt reports that she has had back pain on the R side since 2pm yesterday. Pt also has had mucuus discharge since that time.

## 2017-02-27 NOTE — MAU Provider Note (Signed)
Chief Complaint  Patient presents with  . Decreased Fetal Movement     First Provider Initiated Contact with Patient 02/27/17 [phone]    S: Mathis FareLathi H Tregoning  is a 26 y.o. y.o. year old [id number] female at 601w4d weeks gestation who presents to MAU reporting decreased fetal movement since 0100. And constant low back and low abd pain x several days   Contractions: rare Vaginal bleeding: None Leaking of fluid: None  Patient Active Problem List   Diagnosis Date Noted  . Pyelonephritis affecting pregnancy in first trimester 09/16/2016    O:  Patient Vitals for the past 24 hrs:  BP Temp Pulse Resp SpO2  02/27/17 0533 107/68 97.9 F (36.6 C) 79 18 96 %   General: NAD Heart: Regular rate Lungs: Normal rate and effort Abd: Soft, NT, Gravid, S=D Pelvic: NEFG, no blood.  Dilation: Closed Effacement (%): Thick Exam by:: Ivonne AndrewV Jaquel Glassburn CNM  NST performed EFM: 125, mod variability, 15x15 accels, no decels Toco: Rare, mild  Results for orders placed or performed during the hospital encounter of 02/27/17 (from the past 24 hour(s))  Urinalysis, Routine w reflex microscopic     Status: Abnormal   Collection Time: 02/27/17  5:20 AM  Result Value Ref Range   Color, Urine YELLOW YELLOW   APPearance CLEAR CLEAR   Specific Gravity, Urine 1.009 1.005 - 1.030   pH 6.0 5.0 - 8.0   Glucose, UA NEGATIVE NEGATIVE mg/dL   Hgb urine dipstick NEGATIVE NEGATIVE   Bilirubin Urine NEGATIVE NEGATIVE   Ketones, ur NEGATIVE NEGATIVE mg/dL   Protein, ur NEGATIVE NEGATIVE mg/dL   Nitrite NEGATIVE NEGATIVE   Leukocytes, UA SMALL (A) NEGATIVE   RBC / HPF 0-5 0 - 5 RBC/hpf   WBC, UA 6-30 0 - 5 WBC/hpf   Bacteria, UA RARE (A) NONE SEEN   Squamous Epithelial / LPF 0-5 (A) NONE SEEN   Mucous PRESENT   Wet prep, genital     Status: Abnormal   Collection Time: 02/27/17  6:34 AM  Result Value Ref Range   Yeast Wet Prep HPF POC NONE SEEN NONE SEEN   Trich, Wet Prep NONE SEEN NONE SEEN   Clue Cells Wet Prep HPF POC NONE  SEEN NONE SEEN   WBC, Wet Prep HPF POC TOO NUMEROUS TO COUNT (A) NONE SEEN   Sperm NONE SEEN      A: 881w4d week IUP Decreased fetal movement resolved with reactive NST.  P: Discharge home in stable condition. Preterm Labor precautions and fetal kick counts. Follow-up as scheduled for prenatal visit or sooner as needed if symptoms worsen. Return to maternity admissions as needed if symptoms worsen.  Katrinka BlazingSmith, [place], CNM 02/27/2017 7:12 AM  2

## 2017-03-20 LAB — OB RESULTS CONSOLE GC/CHLAMYDIA
Chlamydia: NEGATIVE
GC PROBE AMP, GENITAL: NEGATIVE

## 2017-03-20 LAB — OB RESULTS CONSOLE GBS: STREP GROUP B AG: NEGATIVE

## 2017-04-06 ENCOUNTER — Inpatient Hospital Stay (HOSPITAL_COMMUNITY)
Admission: AD | Admit: 2017-04-06 | Discharge: 2017-04-07 | Disposition: A | Discharge status: Home / Self Care | Payer: Medicaid Other | Source: Ambulatory Visit | Attending: Obstetrics & Gynecology | Admitting: Obstetrics & Gynecology

## 2017-04-06 ENCOUNTER — Encounter (HOSPITAL_COMMUNITY): Payer: Self-pay

## 2017-04-06 DIAGNOSIS — O479 False labor, unspecified: Secondary | ICD-10-CM

## 2017-04-06 DIAGNOSIS — Z3A00 Weeks of gestation of pregnancy not specified: Secondary | ICD-10-CM | POA: Insufficient documentation

## 2017-04-06 NOTE — MAU Note (Signed)
Pt c/o contractions that started yesterday, but are not every 10 mins apart. Pt denies vaginal bleeding or LOF. Reports good fetal movement. Cervix has not been checked.

## 2017-04-07 DIAGNOSIS — O479 False labor, unspecified: Secondary | ICD-10-CM | POA: Diagnosis not present

## 2017-04-07 DIAGNOSIS — Z3A00 Weeks of gestation of pregnancy not specified: Secondary | ICD-10-CM | POA: Diagnosis not present

## 2017-04-07 NOTE — MAU Note (Signed)
I have communicated with Cleone Slimaroline Neill, SNM and reviewed vital signs:  Vitals:   04/06/17 2302  BP: 101/68  Pulse: (!) 103  Resp: 18  Temp: 97.9 F (36.6 C)    Vaginal exam:  Dilation: 2 Effacement (%): 50, 60 Station: -2 Presentation: Vertex Exam by:: A Ezzard Ditmer, RN,   Also reviewed contraction pattern and that non-stress test is reactive.  It has been documented that patient is contracting every 10 minutes with no cervical change over 1.5 hours not indicating active labor.  Patient denies any other complaints.  Based on this report provider has given order for discharge.  A discharge order and diagnosis entered by a provider.   Labor discharge instructions reviewed with patient.

## 2017-04-07 NOTE — Discharge Instructions (Signed)
Third Trimester of Pregnancy The third trimester is from week 28 through week 40 (months 7 through 9). The third trimester is a time when the unborn baby (fetus) is growing rapidly. At the end of the ninth month, the fetus is about 20 inches in length and weighs 6-10 pounds. Body changes during your third trimester Your body will continue to go through many changes during pregnancy. The changes vary from woman to woman. During the third trimester:  Your weight will continue to increase. You can expect to gain 25-35 pounds (11-16 kg) by the end of the pregnancy.  You may begin to get stretch marks on your hips, abdomen, and breasts.  You may urinate more often because the fetus is moving lower into your pelvis and pressing on your bladder.  You may develop or continue to have heartburn. This is caused by increased hormones that slow down muscles in the digestive tract.  You may develop or continue to have constipation because increased hormones slow digestion and cause the muscles that push waste through your intestines to relax.  You may develop hemorrhoids. These are swollen veins (varicose veins) in the rectum that can itch or be painful.  You may develop swollen, bulging veins (varicose veins) in your legs.  You may have increased body aches in the pelvis, back, or thighs. This is due to weight gain and increased hormones that are relaxing your joints.  You may have changes in your hair. These can include thickening of your hair, rapid growth, and changes in texture. Some women also have hair loss during or after pregnancy, or hair that feels dry or thin. Your hair will most likely return to normal after your baby is born.  Your breasts will continue to grow and they will continue to become tender. A yellow fluid (colostrum) may leak from your breasts. This is the first milk you are producing for your baby.  Your belly button may stick out.  You may notice more swelling in your hands,  face, or ankles.  You may have increased tingling or numbness in your hands, arms, and legs. The skin on your belly may also feel numb.  You may feel short of breath because of your expanding uterus.  You may have more problems sleeping. This can be caused by the size of your belly, increased need to urinate, and an increase in your body's metabolism.  You may notice the fetus "dropping," or moving lower in your abdomen (lightening).  You may have increased vaginal discharge.  You may notice your joints feel loose and you may have pain around your pelvic bone.  What to expect at prenatal visits You will have prenatal exams every 2 weeks until week 36. Then you will have weekly prenatal exams. During a routine prenatal visit:  You will be weighed to make sure you and the baby are growing normally.  Your blood pressure will be taken.  Your abdomen will be measured to track your baby's growth.  The fetal heartbeat will be listened to.  Any test results from the previous visit will be discussed.  You may have a cervical check near your due date to see if your cervix has softened or thinned (effaced).  You will be tested for Group B streptococcus. This happens between 35 and 37 weeks.  Your health care provider may ask you:  What your birth plan is.  How you are feeling.  If you are feeling the baby move.  If you have had  any abnormal symptoms, such as leaking fluid, bleeding, severe headaches, or abdominal cramping.  If you are using any tobacco products, including cigarettes, chewing tobacco, and electronic cigarettes.  If you have any questions.  Other tests or screenings that may be performed during your third trimester include:  Blood tests that check for low iron levels (anemia).  Fetal testing to check the health, activity level, and growth of the fetus. Testing is done if you have certain medical conditions or if there are problems during the  pregnancy.  Nonstress test (NST). This test checks the health of your baby to make sure there are no signs of problems, such as the baby not getting enough oxygen. During this test, a belt is placed around your belly. The baby is made to move, and its heart rate is monitored during movement.  What is false labor? False labor is a condition in which you feel small, irregular tightenings of the muscles in the womb (contractions) that usually go away with rest, changing position, or drinking water. These are called Braxton Hicks contractions. Contractions may last for hours, days, or even weeks before true labor sets in. If contractions come at regular intervals, become more frequent, increase in intensity, or become painful, you should see your health care provider. What are the signs of labor?  Abdominal cramps.  Regular contractions that start at 10 minutes apart and become stronger and more frequent with time.  Contractions that start on the top of the uterus and spread down to the lower abdomen and back.  Increased pelvic pressure and dull back pain.  A watery or bloody mucus discharge that comes from the vagina.  Leaking of amniotic fluid. This is also known as your "water breaking." It could be a slow trickle or a gush. Let your health care provider know if it has a color or strange odor. If you have any of these signs, call your health care provider right away, even if it is before your due date. Follow these instructions at home: Medicines  Follow your health care provider's instructions regarding medicine use. Specific medicines may be either safe or unsafe to take during pregnancy.  Take a prenatal vitamin that contains at least 600 micrograms (mcg) of folic acid.  If you develop constipation, try taking a stool softener if your health care provider approves. Eating and drinking  Eat a balanced diet that includes fresh fruits and vegetables, whole grains, good sources of protein  such as meat, eggs, or tofu, and low-fat dairy. Your health care provider will help you determine the amount of weight gain that is right for you.  Avoid raw meat and uncooked cheese. These carry germs that can cause birth defects in the baby.  If you have low calcium intake from food, talk to your health care provider about whether you should take a daily calcium supplement.  Eat four or five small meals rather than three large meals a day.  Limit foods that are high in fat and processed sugars, such as fried and sweet foods.  To prevent constipation: ? Drink enough fluid to keep your urine clear or pale yellow. ? Eat foods that are high in fiber, such as fresh fruits and vegetables, whole grains, and beans. Activity  Exercise only as directed by your health care provider. Most women can continue their usual exercise routine during pregnancy. Try to exercise for 30 minutes at least 5 days a week. Stop exercising if you experience uterine contractions.  Avoid heavy  lifting.  Do not exercise in extreme heat or humidity, or at high altitudes.  Wear low-heel, comfortable shoes.  Practice good posture.  You may continue to have sex unless your health care provider tells you otherwise. Relieving pain and discomfort  Take frequent breaks and rest with your legs elevated if you have leg cramps or low back pain.  Take warm sitz baths to soothe any pain or discomfort caused by hemorrhoids. Use hemorrhoid cream if your health care provider approves.  Wear a good support bra to prevent discomfort from breast tenderness.  If you develop varicose veins: ? Wear support pantyhose or compression stockings as told by your healthcare provider. ? Elevate your feet for 15 minutes, 3-4 times a day. Prenatal care  Write down your questions. Take them to your prenatal visits.  Keep all your prenatal visits as told by your health care provider. This is important. Safety  Wear your seat belt at  all times when driving.  Make a list of emergency phone numbers, including numbers for family, friends, the hospital, and police and fire departments. General instructions  Avoid cat litter boxes and soil used by cats. These carry germs that can cause birth defects in the baby. If you have a cat, ask someone to clean the litter box for you.  Do not travel far distances unless it is absolutely necessary and only with the approval of your health care provider.  Do not use hot tubs, steam rooms, or saunas.  Do not drink alcohol.  Do not use any products that contain nicotine or tobacco, such as cigarettes and e-cigarettes. If you need help quitting, ask your health care provider.  Do not use any medicinal herbs or unprescribed drugs. These chemicals affect the formation and growth of the baby.  Do not douche or use tampons or scented sanitary pads.  Do not cross your legs for long periods of time.  To prepare for the arrival of your baby: ? Take prenatal classes to understand, practice, and ask questions about labor and delivery. ? Make a trial run to the hospital. ? Visit the hospital and tour the maternity area. ? Arrange for maternity or paternity leave through employers. ? Arrange for family and friends to take care of pets while you are in the hospital. ? Purchase a rear-facing car seat and make sure you know how to install it in your car. ? Pack your hospital bag. ? Prepare the babys nursery. Make sure to remove all pillows and stuffed animals from the baby's crib to prevent suffocation.  Visit your dentist if you have not gone during your pregnancy. Use a soft toothbrush to brush your teeth and be gentle when you floss. Contact a health care provider if:  You are unsure if you are in labor or if your water has broken.  You become dizzy.  You have mild pelvic cramps, pelvic pressure, or nagging pain in your abdominal area.  You have lower back pain.  You have persistent  nausea, vomiting, or diarrhea.  You have an unusual or bad smelling vaginal discharge.  You have pain when you urinate. Get help right away if:  Your water breaks before 37 weeks.  You have regular contractions less than 5 minutes apart before 37 weeks.  You have a fever.  You are leaking fluid from your vagina.  You have spotting or bleeding from your vagina.  You have severe abdominal pain or cramping.  You have rapid weight loss or weight gain.  You have shortness of breath with chest pain.  You notice sudden or extreme swelling of your face, hands, ankles, feet, or legs.  Your baby makes fewer than 10 movements in 2 hours.  You have severe headaches that do not go away when you take medicine.  You have vision changes. Summary  The third trimester is from week 28 through week 40, months 7 through 9. The third trimester is a time when the unborn baby (fetus) is growing rapidly.  During the third trimester, your discomfort may increase as you and your baby continue to gain weight. You may have abdominal, leg, and back pain, sleeping problems, and an increased need to urinate.  During the third trimester your breasts will keep growing and they will continue to become tender. A yellow fluid (colostrum) may leak from your breasts. This is the first milk you are producing for your baby.  False labor is a condition in which you feel small, irregular tightenings of the muscles in the womb (contractions) that eventually go away. These are called Braxton Hicks contractions. Contractions may last for hours, days, or even weeks before true labor sets in.  Signs of labor can include: abdominal cramps; regular contractions that start at 10 minutes apart and become stronger and more frequent with time; watery or bloody mucus discharge that comes from the vagina; increased pelvic pressure and dull back pain; and leaking of amniotic fluid. This information is not intended to replace advice  given to you by your health care provider. Make sure you discuss any questions you have with your health care provider. Document Released: 10/01/2001 Document Revised: 03/14/2016 Document Reviewed: 12/08/2012 Elsevier Interactive Patient Education  2017 [employer] Fetal Movement Counts Patient Name: ________________________________________________ Patient Due Date: ____________________ What is a fetal movement count? A fetal movement count is the number of times that you feel your baby move during a certain amount of time. This may also be called a fetal kick count. A fetal movement count is recommended for every pregnant woman. You may be asked to start counting fetal movements as early as week 28 of your pregnancy. Pay attention to when your baby is most active. You may notice your baby's sleep and wake cycles. You may also notice things that make your baby move more. You should do a fetal movement count:  When your baby is normally most active.  At the same time each day.  A good time to count movements is while you are resting, after having something to eat and drink. How do I count fetal movements? 1. Find a quiet, comfortable area. Sit, or lie down on your side. 2. Write down the date, the start time and stop time, and the number of movements that you felt between those two times. Take this information with you to your health care visits. 3. For 2 hours, count kicks, flutters, swishes, rolls, and jabs. You should feel at least 10 movements during 2 hours. 4. You may stop counting after you have felt 10 movements. 5. If you do not feel 10 movements in 2 hours, have something to eat and drink. Then, keep resting and counting for 1 hour. If you feel at least 4 movements during that hour, you may stop counting. Contact a health care provider if:  You feel fewer than 4 movements in 2 hours.  Your baby is not moving like he or she usually does. Date: ____________ Start time: ____________  Stop time: ____________ Movements: ____________ Date: ____________ Start time:  ____________ Stop time: ____________ Movements: ____________ Date: ____________ Start time: ____________ Stop time: ____________ Movements: ____________ Date: ____________ Start time: ____________ Stop time: ____________ Movements: ____________ Date: ____________ Start time: ____________ Stop time: ____________ Movements: ____________ Date: ____________ Start time: ____________ Stop time: ____________ Movements: ____________ Date: ____________ Start time: ____________ Stop time: ____________ Movements: ____________ Date: ____________ Start time: ____________ Stop time: ____________ Movements: ____________ Date: ____________ Start time: ____________ Stop time: ____________ Movements: ____________ This information is not intended to replace advice given to you by your health care provider. Make sure you discuss any questions you have with your health care provider. Document Released: 11/06/2006 Document Revised: 06/05/2016 Document Reviewed: 11/16/2015 Elsevier Interactive Patient Education  2018 [employer] of the uterus can occur throughout pregnancy, but they are not always a sign that you are in labor. You may have practice contractions called Braxton Hicks contractions. These false labor contractions are sometimes confused with true labor. What are Deberah Pelton contractions? Braxton Hicks contractions are tightening movements that occur in the muscles of the uterus before labor. Unlike true labor contractions, these contractions do not result in opening (dilation) and thinning of the cervix. Toward the end of pregnancy (32-34 weeks), Braxton Hicks contractions can happen more often and may become stronger. These contractions are sometimes difficult to tell apart from true labor because they can be very uncomfortable. You should not feel embarrassed if you go to the hospital  with false labor. Sometimes, the only way to tell if you are in true labor is for your health care provider to look for changes in the cervix. The health care provider will do a physical exam and may monitor your contractions. If you are not in true labor, the exam should show that your cervix is not dilating and your water has not broken. If there are no prenatal problems or other health problems associated with your pregnancy, it is completely safe for you to be sent home with false labor. You may continue to have Braxton Hicks contractions until you go into true labor. How can I tell the difference between true labor and false labor?  Differences ? False labor ? Contractions last 30-70 seconds.: Contractions are usually shorter and not as strong as true labor contractions. ? Contractions become very regular.: Contractions are usually irregular. ? Discomfort is usually felt in the top of the uterus, and it spreads to the lower abdomen and low back.: Contractions are often felt in the front of the lower abdomen and in the groin. ? Contractions do not go away with walking.: Contractions may go away when you walk around or change positions while lying down. ? Contractions usually become more intense and increase in frequency.: Contractions get weaker and are shorter-lasting as time goes on. ? The cervix dilates and gets thinner.: The cervix usually does not dilate or become thin. Follow these instructions at home:  Take over-the-counter and prescription medicines only as told by your health care provider.  Keep up with your usual exercises and follow other instructions from your health care provider.  Eat and drink lightly if you think you are going into labor.  If Braxton Hicks contractions are making you uncomfortable: ? Change your position from lying down or resting to walking, or change from walking to resting. ? Sit and rest in a tub of warm water. ? Drink enough fluid to keep your urine  clear or pale yellow. Dehydration may cause these contractions. ? Do slow and deep breathing  several times an hour.  Keep all follow-up prenatal visits as told by your health care provider. This is important. Contact a health care provider if:  You have a fever.  You have continuous pain in your abdomen. Get help right away if:  Your contractions become stronger, more regular, and closer together.  You have fluid leaking or gushing from your vagina.  You pass blood-tinged mucus (bloody show).  You have bleeding from your vagina.  You have low back pain that you never had before.  You feel your babys head pushing down and causing pelvic pressure.  Your baby is not moving inside you as much as it used to. Summary  Contractions that occur before labor are called Braxton Hicks contractions, false labor, or practice contractions.  Braxton Hicks contractions are usually shorter, weaker, farther apart, and less regular than true labor contractions. True labor contractions usually become progressively stronger and regular and they become more frequent.  Manage discomfort from [facility] contractions by changing position, resting in a warm bath, drinking plenty of water, or practicing deep breathing. This information is not intended to replace advice given to you by your health care provider. Make sure you discuss any questions you have with your health care provider. Document Released: 10/07/2005 Document Revised: 08/26/2016 Document Reviewed: 08/26/2016 Elsevier Interactive Patient Education  2017 [employer]

## 2017-04-12 ENCOUNTER — Inpatient Hospital Stay (HOSPITAL_COMMUNITY)
Admission: AD | Admit: 2017-04-12 | Discharge: 2017-04-14 | DRG: 775 | Disposition: A | Discharge status: Home / Self Care | Payer: Medicaid Other | Source: Ambulatory Visit | Attending: Obstetrics and Gynecology | Admitting: Obstetrics and Gynecology

## 2017-04-12 ENCOUNTER — Encounter (HOSPITAL_COMMUNITY): Payer: Self-pay

## 2017-04-12 DIAGNOSIS — O4202 Full-term premature rupture of membranes, onset of labor within 24 hours of rupture: Secondary | ICD-10-CM

## 2017-04-12 DIAGNOSIS — O4292 Full-term premature rupture of membranes, unspecified as to length of time between rupture and onset of labor: Principal | ICD-10-CM | POA: Diagnosis present

## 2017-04-12 DIAGNOSIS — Z3A38 38 weeks gestation of pregnancy: Secondary | ICD-10-CM

## 2017-04-12 LAB — CBC
HEMATOCRIT: 35.8 % — AB (ref 36.0–46.0)
HEMOGLOBIN: 11.9 g/dL — AB (ref 12.0–15.0)
MCH: 22.7 pg — AB (ref 26.0–34.0)
MCHC: 33.2 g/dL (ref 30.0–36.0)
MCV: 68.3 fL — ABNORMAL LOW (ref 78.0–100.0)
Platelets: 344 10*3/uL (ref 150–400)
RBC: 5.24 MIL/uL — ABNORMAL HIGH (ref 3.87–5.11)
RDW: 15.5 % (ref 11.5–15.5)
WBC: 13 10*3/uL — ABNORMAL HIGH (ref 4.0–10.5)

## 2017-04-12 LAB — TYPE AND SCREEN
ABO/RH(D): O POS
Antibody Screen: NEGATIVE

## 2017-04-12 LAB — POCT FERN TEST: POCT Fern Test: NEGATIVE

## 2017-04-12 MED ORDER — SIMETHICONE 80 MG PO CHEW: 80.0000 mg | CHEWABLE_TABLET | ORAL | Status: DC | PRN

## 2017-04-12 MED ORDER — LACTATED RINGERS IV SOLN
INTRAVENOUS | Status: DC
  Administered 2017-04-12: 125 mL/h via INTRAVENOUS

## 2017-04-12 MED ORDER — ONDANSETRON HCL 4 MG/2ML IJ SOLN: 4.0000 mg | Freq: Four times a day (QID) | INTRAMUSCULAR | Status: DC | PRN

## 2017-04-12 MED ORDER — WITCH HAZEL-GLYCERIN EX PADS: 1.0000 "application " | MEDICATED_PAD | CUTANEOUS | Status: DC | PRN

## 2017-04-12 MED ORDER — OXYTOCIN 40 UNITS IN LACTATED RINGERS INFUSION - SIMPLE MED: 1.0000 m[IU]/min | INTRAVENOUS | Status: DC

## 2017-04-12 MED ORDER — BENZOCAINE-MENTHOL 20-0.5 % EX AERO: 1.0000 "application " | INHALATION_SPRAY | CUTANEOUS | Status: DC | PRN

## 2017-04-12 MED ORDER — IBUPROFEN 600 MG PO TABS
600.0000 mg | ORAL_TABLET | Freq: Four times a day (QID) | ORAL | Status: DC
  Administered 2017-04-12 – 2017-04-14 (×7): 600 mg via ORAL
  Filled 2017-04-12 (×7): qty 1

## 2017-04-12 MED ORDER — OXYTOCIN 40 UNITS IN LACTATED RINGERS INFUSION - SIMPLE MED
2.5000 [IU]/h | INTRAVENOUS | Status: DC
  Filled 2017-04-12 (×2): qty 1000

## 2017-04-12 MED ORDER — ONDANSETRON HCL 4 MG/2ML IJ SOLN: 4.0000 mg | INTRAMUSCULAR | Status: DC | PRN

## 2017-04-12 MED ORDER — TERBUTALINE SULFATE 1 MG/ML IJ SOLN
0.2500 mg | Freq: Once | INTRAMUSCULAR | Status: DC | PRN
  Filled 2017-04-12: qty 1

## 2017-04-12 MED ORDER — ZOLPIDEM TARTRATE 5 MG PO TABS: 5.0000 mg | ORAL_TABLET | Freq: Every evening | ORAL | Status: DC | PRN

## 2017-04-12 MED ORDER — ONDANSETRON HCL 4 MG PO TABS: 4.0000 mg | ORAL_TABLET | ORAL | Status: DC | PRN

## 2017-04-12 MED ORDER — LIDOCAINE HCL (PF) 1 % IJ SOLN
30.0000 mL | INTRAMUSCULAR | Status: DC | PRN
  Administered 2017-04-12: 30 mL via SUBCUTANEOUS
  Filled 2017-04-12: qty 30

## 2017-04-12 MED ORDER — COCONUT OIL OIL: 1.0000 "application " | TOPICAL_OIL | Status: DC | PRN

## 2017-04-12 MED ORDER — SOD CITRATE-CITRIC ACID 500-334 MG/5ML PO SOLN: 30.0000 mL | ORAL | Status: DC | PRN

## 2017-04-12 MED ORDER — TETANUS-DIPHTH-ACELL PERTUSSIS 5-2.5-18.5 LF-MCG/0.5 IM SUSP: 0.5000 mL | Freq: Once | INTRAMUSCULAR | Status: DC

## 2017-04-12 MED ORDER — OXYTOCIN BOLUS FROM INFUSION
500.0000 mL | Freq: Once | INTRAVENOUS | Status: AC
  Administered 2017-04-12: 500 mL via INTRAVENOUS

## 2017-04-12 MED ORDER — DIPHENHYDRAMINE HCL 25 MG PO CAPS: 25.0000 mg | ORAL_CAPSULE | Freq: Four times a day (QID) | ORAL | Status: DC | PRN

## 2017-04-12 MED ORDER — ACETAMINOPHEN 325 MG PO TABS: 650.0000 mg | ORAL_TABLET | ORAL | Status: DC | PRN

## 2017-04-12 MED ORDER — SENNOSIDES-DOCUSATE SODIUM 8.6-50 MG PO TABS
2.0000 | ORAL_TABLET | ORAL | Status: DC
  Administered 2017-04-13 (×2): 2 via ORAL
  Filled 2017-04-12 (×2): qty 2

## 2017-04-12 MED ORDER — DIBUCAINE 1 % RE OINT: 1.0000 "application " | TOPICAL_OINTMENT | RECTAL | Status: DC | PRN

## 2017-04-12 MED ORDER — FLEET ENEMA 7-19 GM/118ML RE ENEM: 1.0000 | ENEMA | RECTAL | Status: DC | PRN

## 2017-04-12 MED ORDER — FENTANYL CITRATE (PF) 100 MCG/2ML IJ SOLN
100.0000 ug | INTRAMUSCULAR | Status: DC | PRN
  Administered 2017-04-12: 100 ug via INTRAVENOUS
  Filled 2017-04-12 (×2): qty 2

## 2017-04-12 MED ORDER — LACTATED RINGERS IV SOLN
500.0000 mL | INTRAVENOUS | Status: DC | PRN
Start: 2017-04-12 — End: 2017-04-13
  Administered 2017-04-12: 500 mL via INTRAVENOUS

## 2017-04-12 MED ORDER — OXYCODONE-ACETAMINOPHEN 5-325 MG PO TABS: 2.0000 | ORAL_TABLET | ORAL | Status: DC | PRN

## 2017-04-12 MED ORDER — MISOPROSTOL 200 MCG PO TABS
50.0000 ug | ORAL_TABLET | ORAL | Status: DC | PRN
  Administered 2017-04-12: 50 ug via ORAL
  Filled 2017-04-12: qty 1

## 2017-04-12 MED ORDER — ACETAMINOPHEN 325 MG PO TABS
650.0000 mg | ORAL_TABLET | ORAL | Status: DC | PRN
Start: 2017-04-12 — End: 2017-04-14
  Administered 2017-04-13 (×2): 650 mg via ORAL
  Filled 2017-04-12 (×2): qty 2

## 2017-04-12 MED ORDER — OXYCODONE-ACETAMINOPHEN 5-325 MG PO TABS: 1.0000 | ORAL_TABLET | ORAL | Status: DC | PRN

## 2017-04-12 MED ORDER — PRENATAL MULTIVITAMIN CH
1.0000 | ORAL_TABLET | Freq: Every day | ORAL | Status: DC
  Administered 2017-04-13: 1 via ORAL
  Filled 2017-04-12 (×2): qty 1

## 2017-04-12 MED ORDER — LACTATED RINGERS IV SOLN
INTRAVENOUS | Status: DC
  Administered 2017-04-12: 21:00:00 via INTRAUTERINE

## 2017-04-12 NOTE — MAU Note (Signed)
Fern neg., [facility]

## 2017-04-12 NOTE — MAU Provider Note (Signed)
CC:  Leaking of fluid, requested by RN to confirm by speculum exam O:  Speculum exam with pink fluid pooling in speculum.  Fern slide made - RN read - positive fern.  Cervical exam - 2 cm, 50%, Vtx confirmed with US by Blanche EastJ Rasch, NP. A:  Spontaneous ROM P:  Admit for labor.  Report given to Dr. Omer JackMumaw.

## 2017-04-12 NOTE — H&P (Signed)
LABOR AND DELIVERY ADMISSION HISTORY AND PHYSICAL NOTE  Rebecca Bryan is a 26 y.o. female [phone] with IUP at [redacted]w[redacted]d by 9 wk ultrasound presenting for PROM.   She reports positive fetal movement. She denies vaginal bleeding. States irregular contractions, not very strong. In MAU, she was evaluated, had +FERN and +Pooling on SSE.  2cm and thick, and by BSUS was vertex.   Prenatal History/Complications:  Past Medical History: Past Medical History:  Diagnosis Date  . Late prenatal care   . Medical history non-contributory   . No pertinent past medical history     Past Surgical History: Past Surgical History:  Procedure Laterality Date  . NO PAST SURGERIES      Obstetrical History: OB History    Gravida Para Term Preterm AB Living   5 3 3  0 1 3   SAB TAB Ectopic Multiple Live Births   1 0 0 0 3      Social History: Social History   Social History  . Marital status: Married    Spouse name: N/A  . Number of children: N/A  . Years of education: N/A   Social History Main Topics  . Smoking status: Never Smoker  . Smokeless tobacco: Never Used  . Alcohol use No  . Drug use: No  . Sexual activity: Not Currently    Birth control/ protection: None   Other Topics Concern  . None   Social History Narrative  . None    Family History: No family history on file.  Allergies: No Known Allergies  Prescriptions Prior to Admission  Medication Sig Dispense Refill Last Dose  . Prenatal Vit-Fe Fumarate-FA (PRENATAL MULTIVITAMIN) TABS Take 1 tablet by mouth daily.   Past Week at Unknown time  . butalbital-acetaminophen-caffeine (FIORICET, ESGIC) 50-325-40 MG tablet Take 1-2 tablets by mouth every 6 (six) hours as needed for headache. (Patient not taking: Reported on 04/12/2017) 20 tablet 0 Not Taking at Unknown time     Review of Systems   All systems reviewed and negative except as stated in HPI  Physical Exam Blood pressure 111/70, pulse 68, temperature 98.3 F (36.8 C),  temperature source Oral, resp. rate 18, height 5' (1.524 m), weight 149 lb (67.6 kg), last menstrual period 07/14/2016, SpO2 97 %, unknown if currently breastfeeding. General appearance: alert, cooperative, appears stated age and no distress Lungs: clear to auscultation bilaterally Heart: regular rate and rhythm Abdomen: soft, non-tender; bowel sounds normal Extremities: No calf swelling or tenderness Presentation: cephalic by BSUS Fetal monitoring: 130 bpm, mod var, +accels, no decels Uterine activity: q3-53min Dilation: 2 Effacement (%): 50 Station: -2 Exam by:: Delrae Sawyers RN   Prenatal labs: ABO, Rh: --/--/O POS (06/23 1330) Antibody: NEG (06/23 1330) Rubella: !Error! IMMUNE RPR: Nonreactive (01/01 0000)  HBsAg: Negative (01/18 0000)  HIV: Other (01/18 0000)  GBS: Negative (05/31 0000)  1 hr Glucola: 173;  3 hr: 80/132/88/118 - normal Genetic screening:  NEG Anatomy US: L choroid plexus cyst  Prenatal Transfer Tool  Maternal Diabetes: No Genetic Screening: Normal Maternal Ultrasounds/Referrals: Abnormal:  Findings:   Isolated choroid plexus cyst - left, 6mm Fetal Ultrasounds or other Referrals:  None Maternal Substance Abuse:  No Significant Maternal Medications:  None Significant Maternal Lab Results: Lab values include: Group B Strep negative, Other:  Hb E trait  Results for orders placed or performed during the hospital encounter of 04/12/17 (from the past 24 hour(s))  CBC   Collection Time: 04/12/17  1:30 PM  Result Value  Ref Range   WBC 13.0 (H) 4.0 - 10.5 K/uL   RBC 5.24 (H) 3.87 - 5.11 MIL/uL   Hemoglobin 11.9 (L) 12.0 - 15.0 g/dL   HCT 40.935.8 (L) 81.136.0 - 91.446.0 %   MCV 68.3 (L) 78.0 - 100.0 fL   MCH 22.7 (L) 26.0 - 34.0 pg   MCHC 33.2 30.0 - 36.0 g/dL   RDW 78.215.5 95.611.5 - 21.315.5 %   Platelets 344 150 - 400 K/uL  Type and screen [facility]   Collection Time: 04/12/17  1:30 PM  Result Value Ref Range   ABO/RH(D) O POS    Antibody Screen NEG     Sample Expiration 04/15/2017   Fern Test   Collection Time: 04/12/17  2:04 PM  Result Value Ref Range   POCT Fern Test Negative = intact amniotic membranes     Patient Active Problem List   Diagnosis Date Noted  . Preterm premature rupture of membranes (PPROM) with unknown onset of labor 04/12/2017  . Pyelonephritis affecting pregnancy in first trimester 09/16/2016    Assessment: Rebecca Bryan is a 26 y.o. [id number] at 6527w6d here for PROM.   #Labor: PROM, PO cytotec #Pain:  Epidural when >3cm; IV pain meds prn #FWB:  Cat I #ID:  GBS Neg #MOF: bottle #MOC:breast #Circ:  N/A - female #Hb E trait  Rebecca MowElizabeth Brayon Bielefeld, DO OB Fellow [facility], [facility] 04/12/2017, 7:07 PM

## 2017-04-12 NOTE — MAU Note (Signed)
Pt. Here for r/o labor and possible SROM.   Pt. Thinks she had a gush of fluid around 0900 this morning, pinkish color fluid, positive for fetal movement and no vaginal bleeding.  EFM applied - FHR 120s, Toco applied - abd. Soft.

## 2017-04-12 NOTE — MAU Note (Signed)
Pt.'s native language is [place], speaks [place], interpreter services offered, pt. decline.

## 2017-04-12 NOTE — Progress Notes (Signed)
S: Patient seen & examined for progress of labor. Patient currently comfortable. Noted to have recurrent variable decels, amnioinfusion initiated.   Dilation: 4 Effacement (%): 70, 80 Cervical Position: Anterior Station: -3 Presentation: Vertex Exam by:: E. Mumaw, MD   FHT: 120bpm, mod var, +accels, +var decels TOCO: irregular, q2-686min   A/P: Labor: progressing normally, s/p cytotec x1@1440 . Can initiate pit after amnioinfusion Pain: IV pain meds FWB: Cat II for variable decels with good variability and accels in between   Continue expectant management Anticipate SVD

## 2017-04-12 NOTE — MAU Note (Addendum)
Started leaking pinkish fluid this morning, around 0900.  No bleeding.  Having contractions. Started after leaking. Was 2 cm when last checked.

## 2017-04-12 NOTE — MAU Note (Signed)
Pt. Transferred to L&D via WC for admission.  Stable Patient.

## 2017-04-13 ENCOUNTER — Encounter (HOSPITAL_COMMUNITY): Payer: Self-pay | Admitting: *Deleted

## 2017-04-13 LAB — RPR: RPR Ser Ql: NONREACTIVE

## 2017-04-13 NOTE — Progress Notes (Signed)
Patient ID: Mathis FareLathi H Mende, female   DOB: 12/19/1990, 26 y.o.   MRN: [id number]  POSTPARTUM PROGRESS NOTE  Post Partum Day #1 Subjective:  Mathis FareLathi H Heier is a 26 y.o. [id number] 5410w6d s/p NSVD.  No acute events overnight.  Pt denies problems with ambulating, voiding or po intake.  She denies nausea or vomiting.  Pain is well controlled.  She has had flatus. She has not had bowel movement.  Lochia Small.   Objective: Blood pressure (!) 97/59, pulse 77, temperature 97.7 F (36.5 C), temperature source Oral, resp. rate 18, height 5' (1.524 m), weight 149 lb (67.6 kg), last menstrual period 07/14/2016, SpO2 97 %, unknown if currently breastfeeding.  Physical Exam:  General: alert, cooperative and no distress Lochia:normal flow Chest: CTAB Heart: RRR no m/r/g Abdomen: +BS, soft, nontender, non-distended Uterine Fundus: firm, below umbilicus DVT Evaluation: No calf swelling or tenderness Extremities: Trace edema   Recent Labs  04/12/17 1330  HGB 11.9*  HCT 35.8*    Assessment/Plan:  ASSESSMENT: Epsie H Siefker is a 26 y.o. [id number] 4010w6d s/p NSVD  Plan for discharge tomorrow and Contraception condoms.  Patient is formula feeding.   LOS: 1 day   Jen MowElizabeth Adriano Bischof, DO OB Fellow [facility], [facility]  04/13/2017, 12:07 PM
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
## 2017-04-14 MED ORDER — IBUPROFEN 600 MG PO TABS: 600.0000 mg | ORAL_TABLET | Freq: Four times a day (QID) | ORAL | 2 refills | Status: DC

## 2017-04-14 NOTE — Discharge Instructions (Signed)
Sinh con qua ????ng âm ?a?o, Ch?m so?c sau sinh °(Vaginal Delivery, Care After) °Hãy tham kh?o t? thông tin này trong vài tu?n t?i. Nh?ng h??ng d?n này cung c?p cho quý v? thông tin v? cách ch?m sóc b?n thân sau khi sinh con qua ???ng âm ?a?o. Chuyên gia ch?m sóc s?c kh?e c?ng có th? có h??ng d?n c? th? h?n cho quý v?. Vi?c ?i?u tr? c?a quý v? ?ã ???c lên k? ho?ch theo th?c hành y khoa hi?n t?i, nh?ng v?n ?? ?ôi khi v?n x?y ra. Hãy g?i cho chuyên gia ch?m sóc s?c kh?e n?u quý v? có b?t k? v?n ?? ho?c th?c m?c nào. °D?? KIÊ?N ?IÊ?U GI? XA?Y RA SAU KHI SINH °Sau khi sinh con qua ????ng âm ?a?o, thông th???ng se? co?: °· Cha?y ma?u mô?t chu?t ?? âm ?a?o. °· ?au nh??c ?? bu?ng, âm ?a?o va? vu?ng da gi??a c?a âm ?a?o va? hâ?u môn (?a?y châ?u). °· Co th??t vu?ng châ?u. °· M?t m?i. °H??NG D?N CH?M SÓC T?I NHÀ °Thuô?c °· Ch? s? d?ng thu?c không c?n kê ??n và thu?c c?n kê ??n theo ch? d?n c?a chuyên gia ch?m sóc s?c kh?e. °· N?u quý v? ???c kê thu?c kháng sinh, hãy dùng thu?c theo ch? d?n c?a chuyên gia ch?m sóc s?c kh?e. Không d??ng du?ng thuô?c kha?ng sinh cho ?ê?n khi hê?t thuô?c. °La?i xe °· Không lái xe ho?c v?n hành máy móc h?ng n?ng trong khi dùng thu?c gi?m ?au ???c kê ??n. °· Không lái xe trong vòng 24 gi? n?u quý v? ?ã dùng thu?c an th?n. °L?i s?ng °· Không u?ng r??u. ?iê?u na?y ??c biê?t quan tro?ng nê?u quy? vi? cho con bu? b??ng s??a me? ho??c ?ang du?ng thuô?c gia?m ?au. °· Không s? d?ng các s?n ph?m thu?c lá nào, bao g?m thu?c lá d?ng hút, thu?c lá d?ng nhai ho?c thu?c lá ?i?n t?. N?u quý v? c?n giúp ?? ?? cai thu?c, hãy h?i chuyên gia ch?m sóc s?c kh?e. °?n và u?ng °· Uô?ng i?t nhâ?t 8 ly mô?i ly ta?m au-x? n???c mô?i nga?y tr?? khi chuyên gia ch?m so?c s??c kho?e ba?o quy? vi? không la?m vâ?y. Nê?u quy? vi? ch?n nuôi con b??ng s??a me?, quy? vi? co? thê? câ?n uô?ng nhiê?u n???c h?n m??c na?y. °· ?n ca?c th??c phâ?m nhiê?u châ?t x? mô?i nga?y. Nh??ng th??c phâ?m na?y co? thê? tra?nh ho??c gia?m ta?o bo?n.  Th??c phâ?m nhiê?u châ?t x? bao gô?m: °? Ngu? cô?c va? ba?nh mi? nguyên ca?m. °? Nga?o l??t. °? ??u. °? Trái cây và rau qua?. °Hoa?t ?ô?ng °· Tr? l?i sinh ho?t bình th??ng theo ch? d?n c?a chuyên gia ch?m sóc s?c kh?e. H?i chuyên gia ch?m sóc s?c kh?e v? các ho?t ??ng nào an toàn cho quý v?. °· Ngh? ng?i càng nhi?u càng t?t. Cô? g??ng nghi? ng?i va? ngu? ch??p m??t khi con quy? vi? ngu?. °· Không nâng bâ?t ky? vâ?t gi? n??ng h?n con quy? vi? ho??c quá 10 lb (4,5 kg) cho ?ê?n khi chuyên gia ch?m so?c s??c kho?e no?i la?m vâ?y la? an toa?n. °· No?i chuyê?n v??i chuyên gia ch?m so?c s??c kho?e cu?a quy? vi? vê? viê?c khi na?o quy? vi? co? thê? quan hê? ti?nh du?c. ?iê?u na?y phu? thuô?c va?o: °? Nguy c? nhiê?m tru?ng. °? Tô?c ?ô? li?n. °? C?m giác thoa?i ma?i va? mong muô?n quan hê? ti?nh du?c. °Ch?m so?c âm ?a?o °· Nê?u quy? vi? bi? ra?ch âm hô? ho??c bi? ra?ch âm ?a?o, ha?y kiê?m tra   khu v??c ?o? mô?i nga?y xem co? dâ?u hiê?u nhiê?m tru?ng không. Kiê?m tra xem: °? T?y ??, s?ng n?, ho?c ?au nhi?u h?n. °? Nhi?u ch?t d?ch ho?c máu h?n. °? ?m. °? M? ho?c mùi hôi. °· Không s?? du?ng nu?t b?ng vê? sinh ho??c thu?t r??a cho ?ê?n khi chuyên gia ch?m so?c s??c kho?e no?i viê?c na?y la? an toa?n. °· Nhi?n xem co? bâ?t ky? cu?c ma?u na?o ma? co? thê? trôi ra t?? âm ?a?o không. Nh??ng cu?c ma?u na?y co? thê? trông nh? nh??ng cu?c khi? h? ?o? thâ?m, nâu ho??c ?en. °H??ng d?n chung °· Gi? cho vu?ng ?a?y châ?u s?ch và khô theo ch? d?n c?a chuyên gia ch?m sóc s?c kh?e. °· M??c quâ?n a?o rô?ng, thoa?i ma?i. °· Lau t?? tr???c ra sau khi quy? vi? ?i vê? sinh. °· Hãy h?i chuyên gia ch?m sóc s?c kh?e xem quý v? có th? t?m vòi hoa sen hay t??m bô?n không. Nê?u quy? vi? bi? ra?ch âm hô? ho??c ra?ch ?a?y châ?u trong lu?c sinh con, chuyên gia ch?m so?c s??c kho?e co? thê? no?i quy? vi? không t??m bô?n trong mô?t khoa?ng th??i gian nhâ?t ?i?nh. °· M??c a?o ng??c hô? tr?? cho vu? va? v??a v??n v??i quy? vi?. °· Nê?u co? thê?, ha?y nh?? ai  giu?p quy? vi? viê?c nha? va? giu?p ch?m so?c con quy? vi? trong i?t nhâ?t va?i nga?y sau khi quy? vi? ra viê?n. °· Tuân th? t?t c? các cu?c h?n khám l?i cho quy? vi? va? con quy? vi? theo ch? d?n c?a chuyên gia ch?m sóc s?c kh?e. ?i?u này có vai trò quan tr?ng. °?I KHÁM N?U: °· Quy? vi? b?: °? Khi? h? ?? âm ?a?o ma? co? mu?i hôi. °? ?i tiê?u khó kh?n. °? ?au khi ?i ti?u. °? ?ô?t ngô?t t?ng ho??c gia?m sô? lâ?n ?a?i tiê?n. °? ?o?, s?ng ho??c ?au nhiê?u h?n quanh chô? ra?ch âm hô? hay chô? ra?ch âm ?a?o. °? Nhiê?u châ?t di?ch ho??c nhi?u ma?u cha?y ra t?? chô? ra?ch âm hô? ho??c chô? ra?ch âm ?a?o h?n. °? Co? mu? ho??c mu?i hôi toa?t ra t?? chô? ra?ch âm hô? ho??c chô? ra?ch âm ?a?o. °? S?t. °? Phát ban. °? I?t quan tâm ho??c không quan tâm ?ê?n ca?c hoa?t ?ô?ng ma? quy? vi? t??ng thi?ch thu?. °? Ca?c câu ho?i vê? ch?m so?c ba?n thân va? con quy? vi?. °· Chô? ra?ch âm hô? ho??c chô? ra?ch âm ?a?o ca?m thâ?y â?m khi cha?m va?o. °· Chô? ra?ch âm hô? ho??c chô? ra?ch âm ?a?o vâ?n bi? h?? va? không co? ve? liê?n la?i. °· Vu? cu?a quy? vi? ?au, c??ng ho??c chuy?n sang màu ?o?. °· Quy? vi? ca?m thâ?y buô?n ho??c lo l??ng bâ?t th???ng. °· Quý v? c?m th?y bu?n nôn ho?c quý v? nôn. °· Quý v? có các c?c máu ?ông l?n trôi ra t? âm ??o. Nê?u quy? vi? co? mô?t cu?c ma?u ?ông trôi ra t?? âm ?a?o, ha?y gi?? la?i ?ê? cho chuyên gia ch?m so?c s??c kho?e cu?a quy? vi? xem. Không xa? trôi ca?c cu?c ma?u ?ông xuô?ng bô?n câ?u ma? không nh?? chuyên gia ch?m so?c s??c kho?e kiê?m tra. °· Quý v? ?i ti?u nhi?u h?n bình th??ng. °· Quy? vi? b? chóng m?t ho?c choa?ng va?ng. °· Quy? vi? không hê? cho con bu? va? quy? vi? ch?a thâ?y kinh trong 12 tuâ?n sau khi sinh con. °· Quy? vi? d??ng cho con bu? va? quy? vi? ch?a thâ?y kinh trong 12 tuâ?n sau khi ng??ng cho con bu?. ° °NGAY   L?P T?C ?I KHÁM N?U: °· Quy? vi? bi?: °? ?au ma? không hê?t ho?c không ??? sau khi du?ng thuô?c. °? ?au ng?c. °? Khó th?. °? Nhi?n m?? ho?c nhi?n thâ?y ?ô?m. °? Y? nghi?  vê? vi?c la?m tô?n th??ng cho ba?n thân ho?c con quy? vi?. °· Quý v? b? ?au b?ng ho?c ?au ? mô?t trong hai chân. °· Quý v? b? ?au ??u d? d?i. °· Quý v? b? ng?t. °· Quy? vi? bi? cha?y ma?u ?? âm ?a?o nhiê?u ?ê?n m??c quy? vi? thâ?m ???t hai b?ng vê? sinh trong vo?ng mô?t gi??. ° °Thông tin này không nh?m m?c ?ích thay th? cho l?i khuyên mà chuyên gia ch?m sóc s?c kh?e nói v?i quý v?. Hãy b?o ??m quý v? ph?i th?o lu?n b?t k? v?n ?? gì mà quý v? có v?i chuyên gia ch?m sóc s?c kh?e c?a quý v?. °Document Released: 10/07/2005 Document Revised: 01/29/2016 Document Reviewed: 10/22/2015 °Elsevier Interactive Patient Education © 2017 Elsevier Inc. ° °

## 2017-04-14 NOTE — Discharge Summary (Signed)
OB Discharge Summary     Patient Name: Rebecca Bryan DOB: 1991/05/27 MRN: [id number]  Date of admission: 04/12/2017 Delivering MD: Ernestina PennaSCHENK, NICHOLAS MICHAEL   Date of discharge: 04/14/2017  Admitting diagnosis: 39.4 WKS, LEAKING, CTXS Intrauterine pregnancy: 82106w6d     Secondary diagnosis:  Active Problems:   Normal labor   Full-term premature rupture of membranes  Additional problems: ESL, hx of pyelo this pregnancy     Discharge diagnosis: Term Pregnancy Delivered                                                                                                Post partum procedures:none  Augmentation: AROM  Complications: None  Hospital course:  Onset of Labor With Vaginal Delivery     26 y.o. yo [id number] at 87106w6d was admitted in Latent Labor on 04/12/2017. Patient had an uncomplicated labor course as follows:  Membrane Rupture Time/Date: 7:15 PM ,04/12/2017   Intrapartum Procedures: Episiotomy: None [1]                                         Lacerations:  1st degree [2]  Patient had a delivery of a Viable infant. 04/12/2017  Information for the patient's newborn:  Dorian HeckleKpa, Girl Selinda OrionLathi [[id number]][[id number]]  Delivery Method: Vag-Spont    Pateint had an uncomplicated postpartum course.  She is ambulating, tolerating a regular diet, passing flatus, and urinating well. Patient is discharged home in stable condition on 04/14/17.   Physical exam  Vitals:   04/13/17 0520 04/13/17 1217 04/13/17 1745 04/14/17 0500  BP: (!) 97/59 (!) 96/57 100/66 103/60  Pulse: 77 76 76 83  Resp: 18 16 16 16   Temp: 97.7 F (36.5 C) 97.8 F (36.6 C) 97.9 F (36.6 C) 98.2 F (36.8 C)  TempSrc: Oral Oral Oral Oral  SpO2:   99% 97%  Weight:      Height:       General: alert, cooperative and no distress Lochia: appropriate Uterine Fundus: firm Incision: 1st deg lac; healing DVT Evaluation: No evidence of DVT seen on physical exam. No cords or calf tenderness. No significant calf/ankle edema. Labs: Lab  Results  Component Value Date   WBC 13.0 (H) 04/12/2017   HGB 11.9 (L) 04/12/2017   HCT 35.8 (L) 04/12/2017   MCV 68.3 (L) 04/12/2017   PLT 344 04/12/2017   CMP Latest Ref Rng & Units 09/16/2016  Glucose 65 - 99 mg/dL 130(Q108(H)  BUN 6 - 20 mg/dL 9  Creatinine 6.570.44 - 8.461.00 mg/dL 9.620.59  Sodium 952135 - 841145 mmol/L 134(L)  Potassium 3.5 - 5.1 mmol/L 4.3  Chloride 101 - 111 mmol/L 101  CO2 22 - 32 mmol/L 25  Calcium 8.9 - 10.3 mg/dL 9.4  Total Protein 6.5 - 8.1 g/dL 7.7  Total Bilirubin 0.3 - 1.2 mg/dL 0.5  Alkaline Phos 38 - 126 U/L 79  AST 15 - 41 U/L 22  ALT 14 - 54 U/L 30    Discharge instruction: per After Visit Summary  and "Baby and Me Booklet".  After visit meds:  Allergies as of 04/14/2017   No Known Allergies     Medication List    TAKE these medications   butalbital-acetaminophen-caffeine 50-325-40 MG tablet Commonly known as:  FIORICET, ESGIC Take 1-2 tablets by mouth every 6 (six) hours as needed for headache.   ibuprofen 600 MG tablet Commonly known as:  ADVIL,MOTRIN Take 1 tablet (600 mg total) by mouth every 6 (six) hours.   prenatal multivitamin Tabs tablet Take 1 tablet by mouth daily.       Diet: routine diet  Activity: Advance as tolerated. Pelvic rest for 6 weeks.   Outpatient follow up:6 weeks Follow up Appt:No future appointments. Follow up Visit:No Follow-up on file.  Postpartum contraception: Condoms  Newborn Data: Live born female  Birth Weight: 6 lb 11.6 oz (3050 g) APGAR: 8, 9  Baby Feeding: Bottle Disposition:home with mother   04/14/2017 Roe Coombs, CNM

## 2017-04-14 NOTE — Progress Notes (Signed)
Post Partum Day #2 Subjective: no complaints, up ad lib, voiding and tolerating PO  Objective: Blood pressure 103/60, pulse 83, temperature 98.2 F (36.8 C), temperature source Oral, resp. rate 16, height 5' (1.524 m), weight 149 lb (67.6 kg), last menstrual period 07/14/2016, SpO2 97 %, unknown if currently breastfeeding.  Physical Exam:  General: alert, cooperative and no distress Lochia: appropriate Uterine Fundus: firm Incision: 1st deg lac, healing DVT Evaluation: No evidence of DVT seen on physical exam. No cords or calf tenderness. No significant calf/ankle edema.   Recent Labs  04/12/17 1330  HGB 11.9*  HCT 35.8*    Assessment/Plan: Discharge home and Contraception Condoms   LOS: 2 days   Roe CoombsRachelle A Timmi Devora, CNM 04/14/2017, 7:49 AM

## 2017-07-15 ENCOUNTER — Encounter (HOSPITAL_COMMUNITY): Payer: Self-pay | Admitting: Emergency Medicine

## 2017-07-15 ENCOUNTER — Emergency Department (HOSPITAL_COMMUNITY)
Admission: EM | Admit: 2017-07-15 | Discharge: 2017-07-15 | Disposition: A | Discharge status: Home / Self Care | Payer: Medicaid Other | Attending: Emergency Medicine | Admitting: Emergency Medicine

## 2017-07-15 DIAGNOSIS — L509 Urticaria, unspecified: Secondary | ICD-10-CM

## 2017-07-15 DIAGNOSIS — R21 Rash and other nonspecific skin eruption: Secondary | ICD-10-CM | POA: Diagnosis present

## 2017-07-15 MED ORDER — PREDNISONE 20 MG PO TABS: 40.0000 mg | ORAL_TABLET | Freq: Every day | ORAL | 0 refills | Status: DC

## 2017-07-15 MED ORDER — PREDNISONE 20 MG PO TABS
60.0000 mg | ORAL_TABLET | Freq: Once | ORAL | Status: AC
  Administered 2017-07-15: 60 mg via ORAL
  Filled 2017-07-15: qty 3

## 2017-07-15 NOTE — ED Triage Notes (Signed)
Pt c/o rash to bilateral arms/legs and abdomen x 1 day. Denies new detergents/lotions/soaps, no known allergies.

## 2017-07-15 NOTE — Discharge Instructions (Signed)
Take Benadryl  every 6 hours as needed for itching. You can get this over the counter. This medicine can make you sleepy Take Prednisone once a day for the next 5 days

## 2017-07-15 NOTE — ED Provider Notes (Signed)
[facility] Provider Note   CSN: [id number] Arrival date & time: 07/15/17  2017     History   Chief Complaint Chief Complaint  Patient presents with  . Rash    HPI Rebecca Bryan is a 26 y.o. female who presents with a rash. No significant PMH. She states that the rash started yesterday. It is all over her body and itchy. She denies new soaps, lotions, detergents, new foods. She works in a [employer] so may have been exposed to chemicals. She has never had this before. She denies throat swelling, SOB, wheezing, N/V. No meds tried PTA.  HPI  Past Medical History:  Diagnosis Date  . Late prenatal care   . Medical history non-contributory   . No pertinent past medical history     Patient Active Problem List   Diagnosis Date Noted  . Normal labor 04/12/2017  . Full-term premature rupture of membranes 04/12/2017  . Pyelonephritis affecting pregnancy in first trimester 09/16/2016    Past Surgical History:  Procedure Laterality Date  . NO PAST SURGERIES      OB History    Gravida Para Term Preterm AB Living   0 1 4   SAB TAB Ectopic Multiple Live Births   1 0 0 0 4       Home Medications    Prior to Admission medications   Medication Sig Start Date End Date Taking? Authorizing Provider  butalbital-acetaminophen-caffeine (FIORICET, ESGIC) 50-325-40 MG tablet Take 1-2 tablets by mouth every 6 (six) hours as needed for headache. Patient not taking: Reported on 04/12/2017 09/18/16 09/18/17  Adam Phenix, MD  ibuprofen (ADVIL,MOTRIN) 600 MG tablet Take 1 tablet (600 mg total) by mouth every 6 (six) hours. 04/14/17   Orvilla Cornwall A, CNM  predniSONE (DELTASONE) 20 MG tablet Take 2 tablets (40 mg total) by mouth daily. 07/15/17   Bethel Born, PA-C  Prenatal Vit-Fe Fumarate-FA (PRENATAL MULTIVITAMIN) TABS Take 1 tablet by mouth daily.    [provider]    Family History No family history on file.  Social History Social History  Substance  Use Topics  . Smoking status: Never Smoker  . Smokeless tobacco: Never Used  . Alcohol use No     Allergies   Patient has no known allergies.   Review of Systems Review of Systems  Constitutional: Negative for fever.  Respiratory: Negative for shortness of breath.   Skin: Positive for rash.     Physical Exam Updated Vital Signs BP (!) 121/92   Pulse 91   Temp 98.3 F (36.8 C) (Oral)   Resp 16   Ht 5' (1.524 m)   Wt 63.5 kg (140 lb)   LMP 06/15/2017   SpO2 99%   BMI 27.34 kg/m   Physical Exam  Constitutional: She is oriented to person, place, and time. She appears well-developed and well-nourished. No distress.  HENT:  Head: Normocephalic and atraumatic.  Eyes: Pupils are equal, round, and reactive to light. Conjunctivae are normal. Right eye exhibits no discharge. Left eye exhibits no discharge. No scleral icterus.  Neck: Normal range of motion.  Cardiovascular: Normal rate.   Pulmonary/Chest: Effort normal. No respiratory distress.  Abdominal: She exhibits no distension.  Neurological: She is alert and oriented to person, place, and time.  Skin: Skin is warm and dry. Rash (generalized hives) noted.  Psychiatric: She has a normal mood and affect. Her behavior is normal.  Nursing note and vitals reviewed.  ED Treatments / Results  Labs (all labs ordered are listed, but only abnormal results are displayed) Labs Reviewed - No data to display  EKG  EKG Interpretation None       Radiology No results found.  Procedures Procedures (including critical care time)  Medications Ordered in ED Medications  predniSONE (DELTASONE) tablet 60 mg (60 mg Oral Given 07/15/17 2257)     Initial Impression / Assessment and Plan / ED Course  I have reviewed the triage vital signs and the nursing notes.  Pertinent labs & imaging results that were available during my care of the patient were reviewed by me and considered in my medical decision making (see chart for  details).  26 year old female presents with hives. Vitals are normal. No signs of distress/anaphylaxis. Will treat with Benadryl and steroid burst. Return precautions given.  Final Clinical Impressions(s) / ED Diagnoses   Final diagnoses:  Hives    New Prescriptions New Prescriptions   PREDNISONE (DELTASONE) 20 MG TABLET    Take 2 tablets (40 mg total) by mouth daily.     Bethel Born, PA-C 07/16/17 Lavonna Monarch    Pricilla Loveless, MD 07/17/17 [phone]

## 2017-07-20 ENCOUNTER — Encounter (HOSPITAL_COMMUNITY): Payer: Self-pay | Admitting: Emergency Medicine

## 2017-07-20 ENCOUNTER — Ambulatory Visit (HOSPITAL_COMMUNITY)
Admission: EM | Admit: 2017-07-20 | Discharge: 2017-07-20 | Disposition: A | Discharge status: Home / Self Care | Payer: Medicaid Other | Attending: Emergency Medicine | Admitting: Emergency Medicine

## 2017-07-20 DIAGNOSIS — L508 Other urticaria: Secondary | ICD-10-CM | POA: Diagnosis not present

## 2017-07-20 DIAGNOSIS — L509 Urticaria, unspecified: Secondary | ICD-10-CM

## 2017-07-20 DIAGNOSIS — R21 Rash and other nonspecific skin eruption: Secondary | ICD-10-CM

## 2017-07-20 MED ORDER — METHYLPREDNISOLONE ACETATE 80 MG/ML IJ SUSP
80.0000 mg | Freq: Once | INTRAMUSCULAR | Status: AC
  Administered 2017-07-20: 80 mg via INTRAMUSCULAR

## 2017-07-20 MED ORDER — METHYLPREDNISOLONE ACETATE 80 MG/ML IJ SUSP
INTRAMUSCULAR | Status: AC
  Filled 2017-07-20: qty 1

## 2017-07-20 NOTE — Discharge Instructions (Signed)
Continue the Benadryl every 4 hours. Start taking Zantac 150 mg twice a day. The injection received today is a long-acting steroid and hopefully this will boost your level in the system and help with itching. If you develop swelling, difficulty breathing, wheezing, cough or vomiting seek medical attention probably.

## 2017-07-20 NOTE — ED Provider Notes (Signed)
[facility]    CSN: [id number] Arrival date & time: 07/20/17  1445     History   Chief Complaint Chief Complaint  Patient presents with  . Rash    HPI Rebecca Bryan is a 26 y.o. female.   26 year old [place] female has had an itchy rash for one week. She was seen in emergency department just a few days ago and diagnosed with urticaria and treated with a bolus of wall prednisone and a prescription for 40 mg daily for 8 days. She was also instructed to take Benadryl every 4 hours when necessary. She states the Benadryl has stopped working for her. The rash is about the same as it was. It is a generalized urticarial rash. Denies problems with breathing, swallowing, swelling, wheezing, nausea or vomiting, weakness or faintiness or headache.      Past Medical History:  Diagnosis Date  . Late prenatal care   . Medical history non-contributory   . No pertinent past medical history     Patient Active Problem List   Diagnosis Date Noted  . Normal labor 04/12/2017  . Full-term premature rupture of membranes 04/12/2017  . Pyelonephritis affecting pregnancy in first trimester 09/16/2016    Past Surgical History:  Procedure Laterality Date  . NO PAST SURGERIES      OB History    Gravida Para Term Preterm AB Living   0 1 4   SAB TAB Ectopic Multiple Live Births   1 0 0 0 4       Home Medications    Prior to Admission medications   Medication Sig Start Date End Date Taking? Authorizing Provider  ibuprofen (ADVIL,MOTRIN) 600 MG tablet Take 1 tablet (600 mg total) by mouth every 6 (six) hours. 04/14/17   Orvilla Cornwall A, CNM  predniSONE (DELTASONE) 20 MG tablet Take 2 tablets (40 mg total) by mouth daily. 07/15/17   Bethel Born, PA-C  Prenatal Vit-Fe Fumarate-FA (PRENATAL MULTIVITAMIN) TABS Take 1 tablet by mouth daily.    [provider]    Family History History reviewed. No pertinent family history.  Social History Social History    Substance Use Topics  . Smoking status: Never Smoker  . Smokeless tobacco: Never Used  . Alcohol use No     Allergies   Patient has no known allergies.   Review of Systems Review of Systems  Constitutional: Negative.        ROS as in per history of present illness  HENT: Negative.   Skin: Positive for rash.  All other systems reviewed and are negative.    Physical Exam Triage Vital Signs ED Triage Vitals [07/20/17 1529]  Enc Vitals Group     BP 116/61     Pulse Rate 99     Resp 20     Temp 97.9 F (36.6 C)     Temp Source Oral     SpO2 99 %     Weight      Height      Head Circumference      Peak Flow      Pain Score      Pain Loc      Pain Edu?      Excl. in GC?    No data found.   Updated Vital Signs BP 116/61 (BP Location: Left Arm)   Pulse 99   Temp 97.9 F (36.6 C) (Oral)   Resp 20   LMP 07/19/2017   SpO2  99%   Breastfeeding? No   Visual Acuity Right Eye Distance:   Left Eye Distance:   Bilateral Distance:    Right Eye Near:   Left Eye Near:    Bilateral Near:     Physical Exam  Constitutional: She is oriented to person, place, and time. She appears well-developed and well-nourished. No distress.  HENT:  Mouth/Throat: Oropharynx is clear and moist. No oropharyngeal exudate.  Eyes: EOM are normal.  Neck: Normal range of motion. Neck supple.  Cardiovascular: Normal rate, normal heart sounds and intact distal pulses.   Pulmonary/Chest: Effort normal and breath sounds normal. No respiratory distress. She has no wheezes.  Musculoskeletal: She exhibits no edema.  Neurological: She is alert and oriented to person, place, and time. She exhibits normal muscle tone.  Skin: Skin is warm and dry.  Generalized wheals, macular and slightly raised consistent with urticaria.  Psychiatric: She has a normal mood and affect.  Nursing note and vitals reviewed.    UC Treatments / Results  Labs (all labs ordered are listed, but only abnormal results  are displayed) Labs Reviewed - No data to display  EKG  EKG Interpretation None       Radiology No results found.  Procedures Procedures (including critical care time)  Medications Ordered in UC Medications  methylPREDNISolone acetate (DEPO-MEDROL) injection 80 mg (not administered)     Initial Impression / Assessment and Plan / UC Course  I have reviewed the triage vital signs and the nursing notes.  Pertinent labs & imaging results that were available during my care of the patient were reviewed by me and considered in my medical decision making (see chart for details).    Continue the Benadryl every 4 hours. Start taking Zantac 150 mg twice a day. The injection received today is a long-acting steroid and hopefully this will boost your level in the system and help with itching. If you develop swelling, difficulty breathing, wheezing, cough or vomiting seek medical attention probably.     Final Clinical Impressions(s) / UC Diagnoses   Final diagnoses:  Hives    New Prescriptions Current Discharge Medication List       Controlled Substance Prescriptions [place] Controlled Substance Registry consulted? Not Applicable   Hayden Rasmussen, NP 07/20/17 1730

## 2017-07-20 NOTE — ED Triage Notes (Signed)
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   A&O x4... NAD... Ambulatory

## 2018-01-19 IMAGING — US US OB COMP LESS 14 WK
2 series · 15 of 20 positions shown · non-contrast
Comparison: None applicable

CLINICAL DATA: Fever and abdominal pain. Confirm pregnancy. Flank
pain. LMP was07/14/2016. Gestational age by LMP is9 weeks 1 day. EDC
by LMP is04/20/2017.

EXAM:
OBSTETRIC <14 WK ULTRASOUND
TECHNIQUE: Transabdominal ultrasound was performed for evaluation of the
gestation as well as the maternal uterus and adnexal regions.

[Series 1: us ob comp less 14 wk · 19 acquisitions, 14 frames shown (1 of 2)]
[im 1/19]
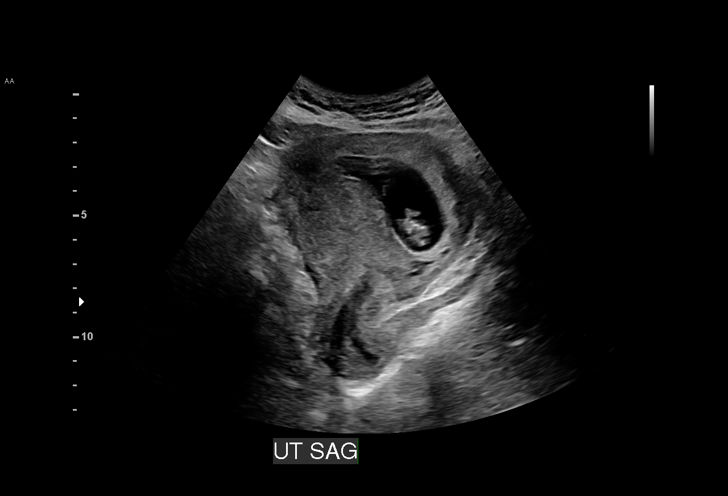
[im 3/19]
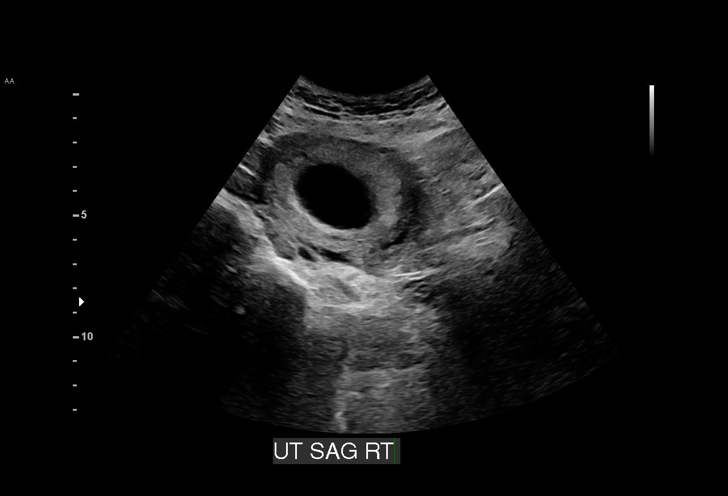
[im 4/19]
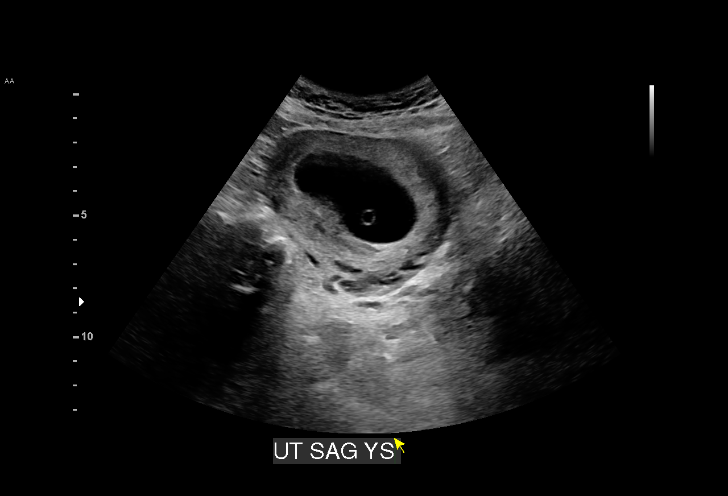
[im 5/19]
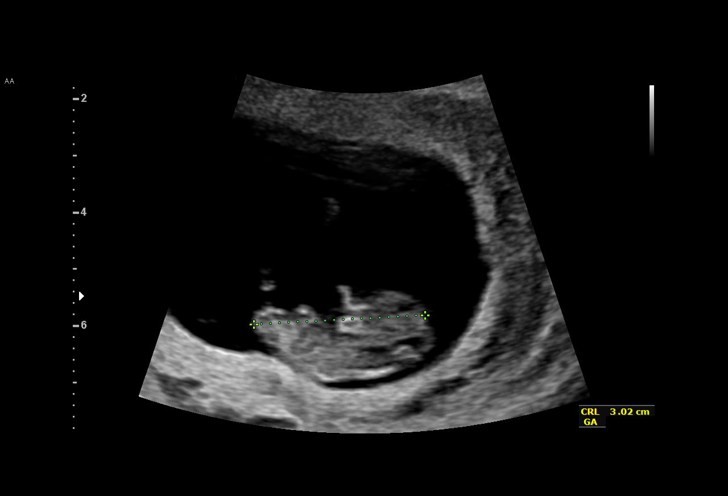
[im 7/19]
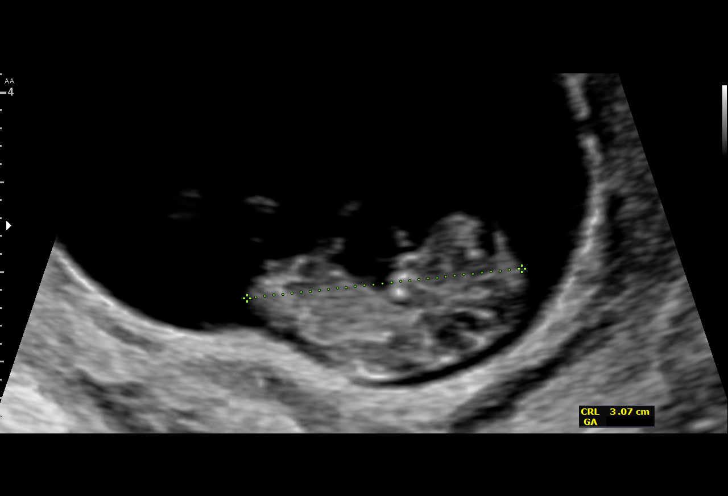
[im 8/19]
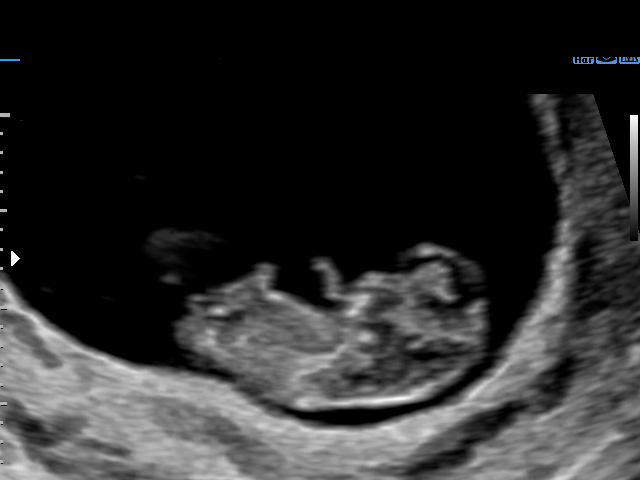
[im 9/19]
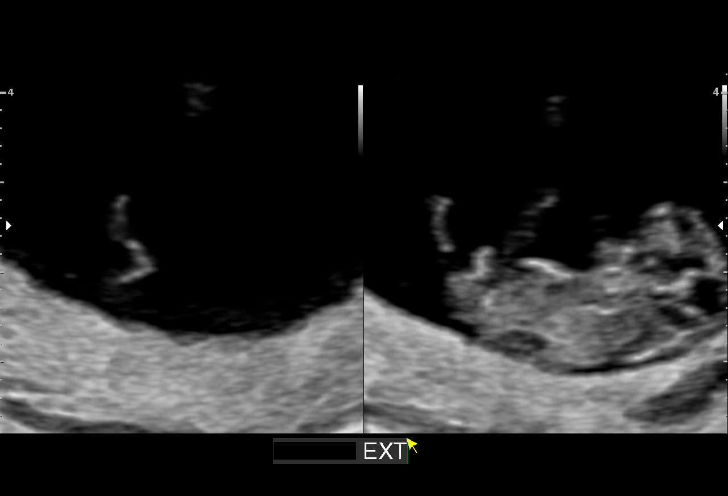
[im 11/19]
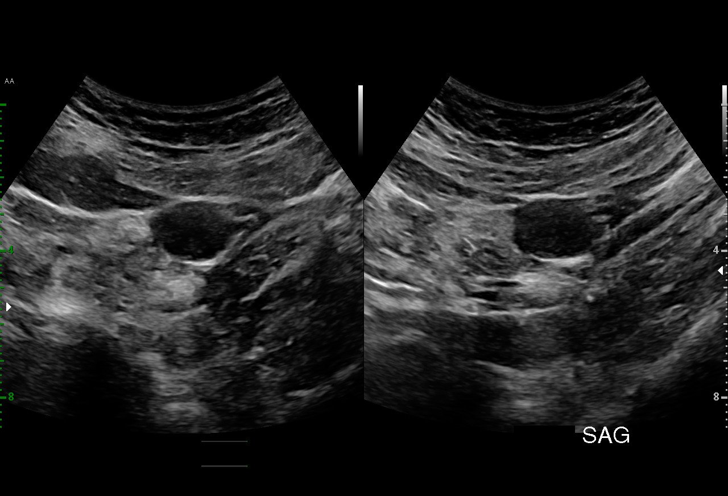
[im 12/19]
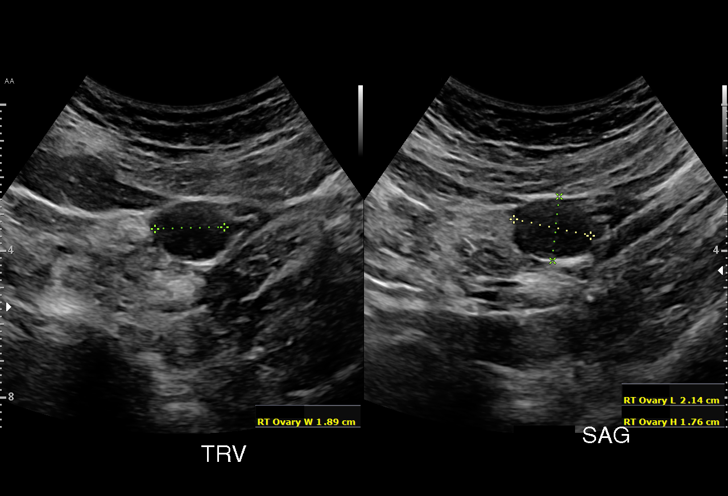
[im 13/19]
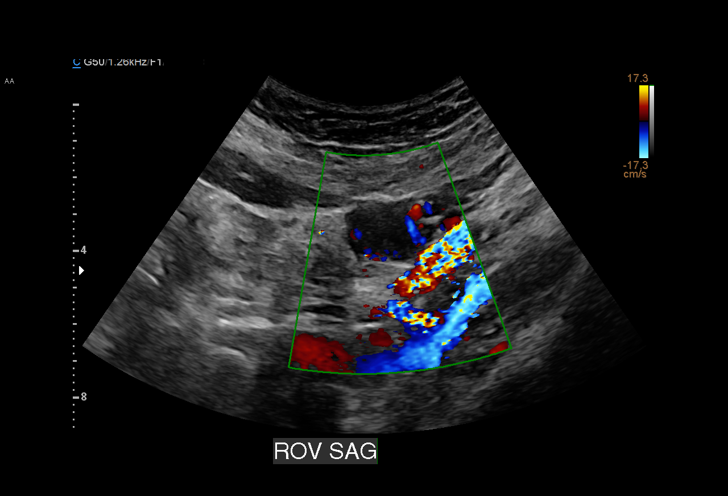
[im 15/19]
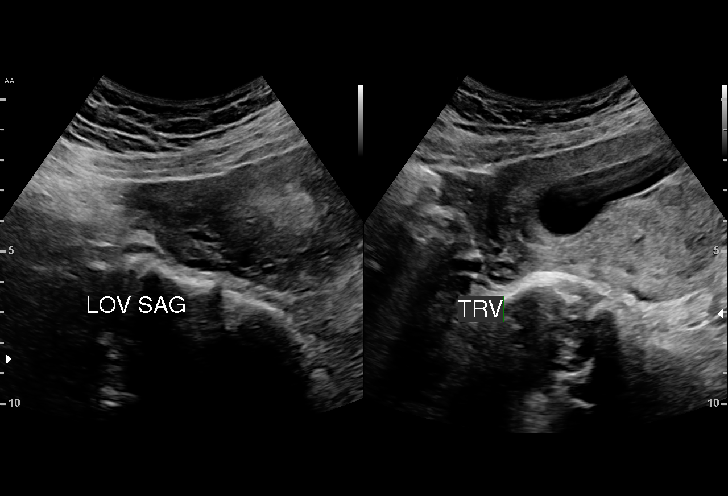
[im 16/19]
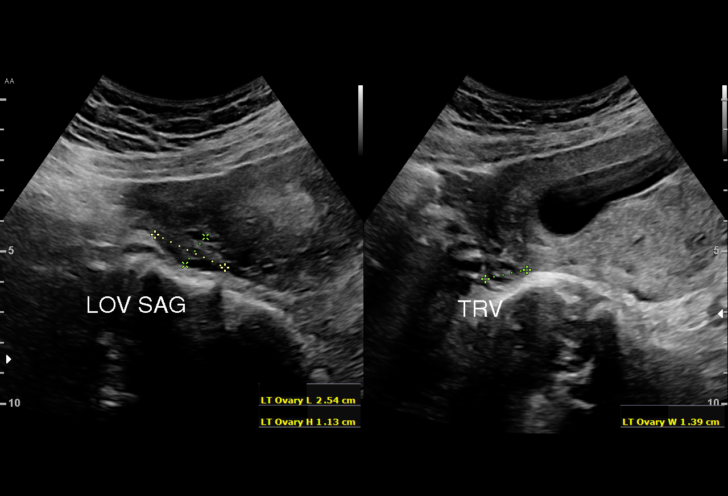
[im 17/19]
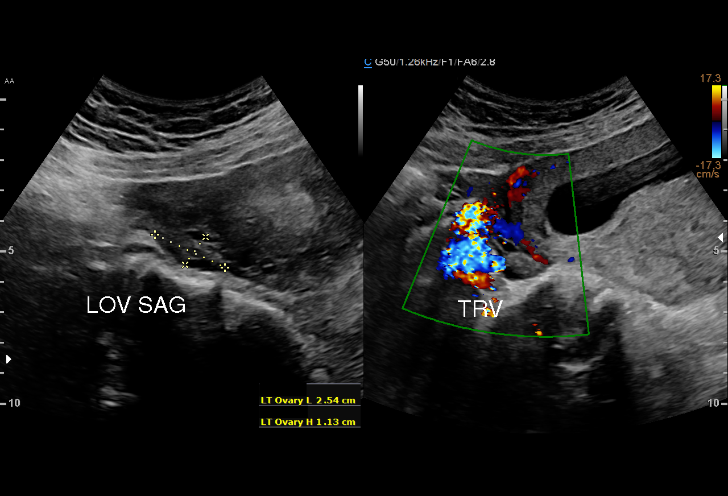
[im 19/19]
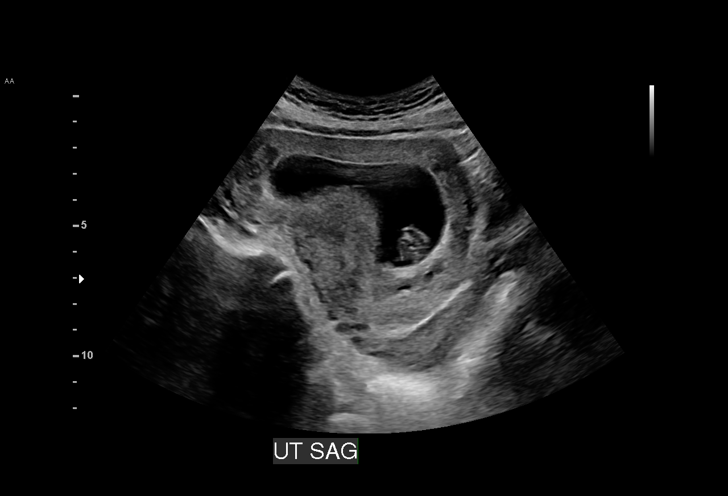

[Series 3: us ob comp less 14 wk · 1 of 1 slices shown (2 of 2)]
[im 1/1]
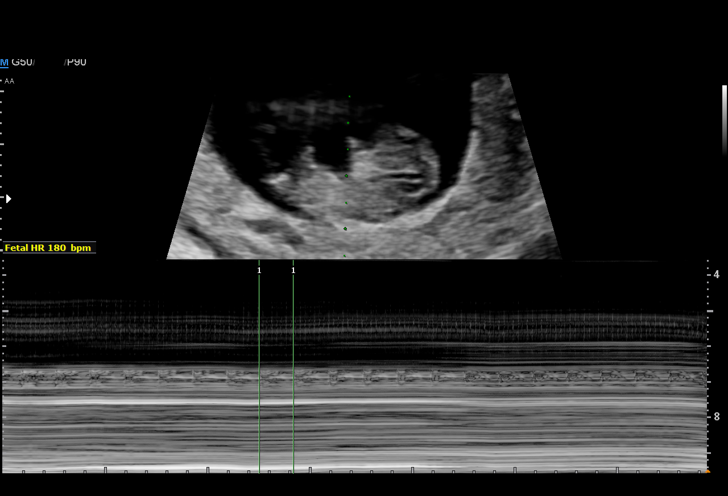

[15 of 20 positions shown; findings below may reference images not displayed]

FINDINGS: Intrauterine gestational sac: Present

Yolk sac:  Present

Embryo:  Present

Cardiac Activity: Present

Heart Rate: 180 bpm

CRL:   30.3  mm   9 w 6 d                  US EDC: 04/15/2017

Subchorionic hemorrhage:  None visualized.

Maternal uterus/adnexae: Ovaries have a normal appearance. No free
pelvic fluid.
IMPRESSION: 1. Single living intrauterine embryo.
2. Size by today's exam confirms clinical EDC of 04/20/2017.

## 2018-12-25 ENCOUNTER — Emergency Department (HOSPITAL_COMMUNITY)
Admission: EM | Admit: 2018-12-25 | Discharge: 2018-12-26 | Disposition: A | Discharge status: Home / Self Care | Payer: Self-pay | Attending: Emergency Medicine | Admitting: Emergency Medicine

## 2018-12-25 ENCOUNTER — Encounter (HOSPITAL_COMMUNITY): Payer: Self-pay | Admitting: Emergency Medicine

## 2018-12-25 ENCOUNTER — Other Ambulatory Visit: Payer: Self-pay

## 2018-12-25 DIAGNOSIS — N3000 Acute cystitis without hematuria: Secondary | ICD-10-CM | POA: Insufficient documentation

## 2018-12-25 DIAGNOSIS — R0602 Shortness of breath: Secondary | ICD-10-CM | POA: Insufficient documentation

## 2018-12-25 DIAGNOSIS — R51 Headache: Secondary | ICD-10-CM | POA: Insufficient documentation

## 2018-12-25 DIAGNOSIS — R0789 Other chest pain: Secondary | ICD-10-CM | POA: Insufficient documentation

## 2018-12-25 LAB — CBC
HEMATOCRIT: 39.7 % (ref 36.0–46.0)
HEMOGLOBIN: 12.6 g/dL (ref 12.0–15.0)
MCH: 22.8 pg — AB (ref 26.0–34.0)
MCHC: 31.7 g/dL (ref 30.0–36.0)
MCV: 71.8 fL — ABNORMAL LOW (ref 80.0–100.0)
Platelets: 328 10*3/uL (ref 150–400)
RBC: 5.53 MIL/uL — ABNORMAL HIGH (ref 3.87–5.11)
RDW: 13.5 % (ref 11.5–15.5)
WBC: 9.9 10*3/uL (ref 4.0–10.5)
nRBC: 0 % (ref 0.0–0.2)

## 2018-12-25 LAB — BASIC METABOLIC PANEL
Anion gap: 6 (ref 5–15)
BUN: 7 mg/dL (ref 6–20)
CO2: 26 mmol/L (ref 22–32)
Calcium: 9 mg/dL (ref 8.9–10.3)
Chloride: 104 mmol/L (ref 98–111)
Creatinine, Ser: 0.81 mg/dL (ref 0.44–1.00)
GFR calc Af Amer: >60 mL/min (ref >60)
GLUCOSE: 164 mg/dL — AB (ref 70–99)
POTASSIUM: 4 mmol/L (ref 3.5–5.1)
Sodium: 136 mmol/L (ref 135–145)

## 2018-12-25 LAB — URINALYSIS, ROUTINE W REFLEX MICROSCOPIC
Bilirubin Urine: NEGATIVE
Glucose, UA: NEGATIVE mg/dL
Hgb urine dipstick: NEGATIVE
Ketones, ur: NEGATIVE mg/dL
Nitrite: NEGATIVE
PROTEIN: NEGATIVE mg/dL
Specific Gravity, Urine: 1.004 — ABNORMAL LOW (ref 1.005–1.030)
pH: 6 (ref 5.0–8.0)

## 2018-12-25 LAB — I-STAT BETA HCG BLOOD, ED (MC, WL, AP ONLY)

## 2018-12-25 NOTE — ED Triage Notes (Signed)
C/o generalized body aches, headache, feeling dizzy, generalized weakness, and difficulty sleeping x 1 week.  Denies fever.  Denies cough.

## 2018-12-26 ENCOUNTER — Emergency Department (HOSPITAL_COMMUNITY): Payer: Self-pay

## 2018-12-26 LAB — I-STAT TROPONIN, ED: TROPONIN I, POC: 0 ng/mL (ref 0.00–0.08)

## 2018-12-26 MED ORDER — CEPHALEXIN 500 MG PO CAPS: 500.0000 mg | ORAL_CAPSULE | Freq: Two times a day (BID) | ORAL | 0 refills | Status: AC

## 2018-12-26 NOTE — ED Notes (Signed)
Patient transported to X-ray 

## 2018-12-26 NOTE — ED Notes (Signed)
ED Provider at bedside. 

## 2018-12-26 NOTE — Discharge Instructions (Signed)
Your laboratory results were normal today.  Your urine did show some bacteria, suspect you are likely suffering from a urinary tract infection.  I will prescribe antibiotics to treat this infection.  Follow-up with your primary care physician is encouraged.  Please return to the ED if you experience a fever, chest pain, shortness of breath.

## 2018-12-26 NOTE — ED Provider Notes (Signed)
[facility] EMERGENCY DEPARTMENT Provider Note   CSN: [id number] Arrival date & time: 12/25/18  2223    History   Chief Complaint Chief Complaint  Patient presents with  . Generalized Body Aches  . Headache    HPI Rebecca Bryan is a 28 y.o. female.     28 y.o female with no PMH presents to the ED with a chief complaint of generalized body aches x 3 weeks. Patient reports she has been feeling weak, dizzy and states her symptoms seem to be worsening. She also reports feeling more fatigue than usual. She endorses some chest discomfort which she reports feeling pressure along with shortness of breath. Patients symptoms are very vague stating she "feels off". She has not tried any therapy for relieve in symptoms. She denies any fever, abdominal pain, dysuria, hematuria, nausea or vomiting. Denies any previous history of blood clots or currently on estrogen.      Past Medical History:  Diagnosis Date  . Late prenatal care   . Medical history non-contributory   . No pertinent past medical history     Patient Active Problem List   Diagnosis Date Noted  . Normal labor 04/12/2017  . Full-term premature rupture of membranes 04/12/2017  . Pyelonephritis affecting pregnancy in first trimester 09/16/2016    Past Surgical History:  Procedure Laterality Date  . NO PAST SURGERIES       OB History    Gravida  5   Para  4   Term  4   Preterm  0   AB  1   Living  4     SAB  1   TAB  0   Ectopic  0   Multiple  0   Live Births  4            Home Medications    Prior to Admission medications   Medication Sig Start Date End Date Taking? Authorizing Provider  cephALEXin (KEFLEX) 500 MG capsule Take 1 capsule (500 mg total) by mouth 2 (two) times daily for 7 days. 12/26/18 01/02/19  Claude Manges, PA-C  ibuprofen (ADVIL,MOTRIN) 600 MG tablet Take 1 tablet (600 mg total) by mouth every 6 (six) hours. 04/14/17   Orvilla Cornwall A, CNM  predniSONE  (DELTASONE) 20 MG tablet Take 2 tablets (40 mg total) by mouth daily. 07/15/17   Bethel Born, PA-C  Prenatal Vit-Fe Fumarate-FA (PRENATAL MULTIVITAMIN) TABS Take 1 tablet by mouth daily.    [provider]    Family History No family history on file.  Social History Social History   Tobacco Use  . Smoking status: Never Smoker  . Smokeless tobacco: Never Used  Substance Use Topics  . Alcohol use: No  . Drug use: No     Allergies   Patient has no known allergies.   Review of Systems Review of Systems  Constitutional: Negative for chills and fever.  HENT: Negative for ear pain and sore throat.   Eyes: Negative for pain and visual disturbance.  Respiratory: Positive for shortness of breath. Negative for cough.   Cardiovascular: Positive for chest pain. Negative for palpitations.  Gastrointestinal: Negative for abdominal pain and vomiting.  Genitourinary: Negative for dysuria and hematuria.  Musculoskeletal: Positive for myalgias. Negative for arthralgias and back pain.  Skin: Negative for color change and rash.  Neurological: Negative for seizures and syncope.  All other systems reviewed and are negative.    Physical Exam Updated Vital Signs BP 103/74 (BP  Location: Right Arm)   Pulse 79   Temp 98.2 F (36.8 C) (Oral)   Resp 20   LMP 12/04/2018   SpO2 98%   Physical Exam Vitals signs and nursing note reviewed.  Constitutional:      General: She is not in acute distress.    Appearance: She is well-developed.  HENT:     Head: Normocephalic and atraumatic.     Mouth/Throat:     Pharynx: No oropharyngeal exudate.  Eyes:     Pupils: Pupils are equal, round, and reactive to light.  Neck:     Musculoskeletal: Normal range of motion.  Cardiovascular:     Rate and Rhythm: Regular rhythm.     Heart sounds: Normal heart sounds.  Pulmonary:     Effort: Pulmonary effort is normal. No respiratory distress.     Breath sounds: Normal breath sounds.    Abdominal:     General: Bowel sounds are normal. There is no distension.     Palpations: Abdomen is soft.     Tenderness: There is no abdominal tenderness.  Musculoskeletal:        General: No tenderness or deformity.     Right lower leg: No edema.     Left lower leg: No edema.  Skin:    General: Skin is warm and dry.  Neurological:     Mental Status: She is alert and oriented to person, place, and time.     Comments: Alert, oriented, thought content appropriate. Speech fluent without evidence of aphasia. Able to follow 2 step commands without difficulty.  Cranial Nerves:  II:  Peripheral visual fields grossly normal, pupils, round, reactive to light III,IV, VI: ptosis not present, extra-ocular motions intact bilaterally  V,VII: smile symmetric, facial light touch sensation equal VIII: hearing grossly normal bilaterally  IX,X: midline uvula rise  XI: bilateral shoulder shrug equal and strong XII: midline tongue extension  Motor:  5/5 in upper and lower extremities bilaterally including strong and equal grip strength and dorsiflexion/plantar flexion Sensory: light touch normal in all extremities.  Cerebellar: normal finger-to-nose with bilateral upper extremities, pronator drift negative       ED Treatments / Results  Labs (all labs ordered are listed, but only abnormal results are displayed) Labs Reviewed  BASIC METABOLIC PANEL - Abnormal; Notable for the following components:      Result Value   Glucose, Bld 164 (*)    All other components within normal limits  CBC - Abnormal; Notable for the following components:   RBC 5.53 (*)    MCV 71.8 (*)    MCH 22.8 (*)    All other components within normal limits  URINALYSIS, ROUTINE W REFLEX MICROSCOPIC - Abnormal; Notable for the following components:   Color, Urine STRAW (*)    APPearance HAZY (*)    Specific Gravity, Urine 1.004 (*)    Leukocytes,Ua LARGE (*)    Bacteria, UA RARE (*)    All other components within  normal limits  URINE CULTURE  I-STAT BETA HCG BLOOD, ED ([facility], [facility], [facility] ONLY)  I-STAT TROPONIN, ED    EKG EKG Interpretation  Date/Time:  Saturday December 26 2018 07:30:36 EST Ventricular Rate:  78 PR Interval:    QRS Duration: 94 QT Interval:  382 QTC Calculation: 436 R Axis:   72 Text Interpretation:  Sinus rhythm Minimal ST depression Baseline wander in lead(s) V1 No old tracing to compare Confirmed by Linwood Dibbles [id number]) on 12/26/2018 7:42:07 AM   Radiology Dg Chest  2 View  Result Date: 12/26/2018 CLINICAL DATA:  Chest pain EXAM: CHEST - 2 VIEW COMPARISON:  None. FINDINGS: The heart size and mediastinal contours are within normal limits. Both lungs are clear. The visualized skeletal structures are unremarkable. IMPRESSION: No active cardiopulmonary disease. Electronically Signed   By: Alcide Clever M.D.   On: 12/26/2018 06:58    Procedures Procedures (including critical care time)  Medications Ordered in ED Medications - No data to display   Initial Impression / Assessment and Plan / ED Course  I have reviewed the triage vital signs and the nursing notes.  Pertinent labs & imaging results that were available during my care of the patient were reviewed by me and considered in my medical decision making (see chart for details).      Patient with a chief complaint of generalized body aches and very vague symptoms. Has a prior history of pyelo, but denies any urinary symptoms at this time such as dysuria, hematuria or frequency. She also endorses some chest discomfort along with shortness of breath. PERC negative.  CBC showed no leukocytosis, hemoglobin is unremarkable. BMP showed no electrolyte abnormality First troponin was negative, EKG showed no changes consistent with infarct. UA showed Large leukocytes, rare bacteria but WBC 6-10 will send UA for culture and will treat at this time. Symptoms are very vague, she has no URI symptoms low suspicion for influenza. Chest xray is clear  and lungs are clear to auscultation. Patient is afebrile with reassuring vital signs.   Patient is encouraged to follow-up with PCP.  Afebrile during ED visit.  Patient understands and agrees with management.  Final Clinical Impressions(s) / ED Diagnoses   Final diagnoses:  Acute cystitis without hematuria    ED Discharge Orders         Ordered    cephALEXin (KEFLEX) 500 MG capsule  2 times daily     12/26/18 0829           Claude Manges, PA-C 12/26/18 0831    Glynn Octave, MD 12/26/18 [phone]

## 2018-12-28 LAB — URINE CULTURE: Culture: 80000 — AB

## 2018-12-29 ENCOUNTER — Telehealth: Payer: Self-pay | Admitting: Emergency Medicine

## 2018-12-29 NOTE — Telephone Encounter (Signed)
Post ED Visit - Positive Culture Follow-up  Culture report reviewed by antimicrobial stewardship pharmacist: [facility] []  Enzo Bi, Pharm.D. []  Celedonio Miyamoto, Pharm.D., BCPS AQ-ID []  Garvin Fila, Pharm.D., BCPS []  Georgina Pillion, [address].D., BCPS []  [place], [place].D., BCPS, AAHIVP []  Estella Husk, Pharm.D., BCPS, AAHIVP []  Lysle Pearl, PharmD, BCPS []  Phillips Climes, PharmD, BCPS []  Agapito Games, PharmD, BCPS []  Verlan Friends, PharmD []  Mervyn Gay, PharmD, BCPS []  Vinnie Level, PharmD Wendelyn Breslow PharmD  [facility] []  Len Childs, PharmD []  Greer Pickerel, PharmD []  Adalberto Cole, PharmD []  Perlie Gold, Rph []  Lonell Face) Jean Rosenthal, PharmD []  Earl Many, PharmD []  Junita Push, PharmD []  Dorna Leitz, PharmD []  Terrilee Files, PharmD []  Lynann Beaver, PharmD []  Keturah Barre, PharmD []  Loralee Pacas, PharmD []  Bernadene Person, PharmD   Positive urine culture Treated with cephalexin, organism sensitive to the same and no further patient follow-up is required at this time.  Berle Mull 12/29/2018, 10:05 AM

## 2019-06-24 ENCOUNTER — Other Ambulatory Visit: Payer: Self-pay

## 2019-06-24 ENCOUNTER — Ambulatory Visit (INDEPENDENT_AMBULATORY_CARE_PROVIDER_SITE_OTHER): Payer: Self-pay | Admitting: Family Medicine

## 2019-06-24 ENCOUNTER — Encounter: Payer: Self-pay | Admitting: Family Medicine

## 2019-06-24 VITALS — BP 108/78 | HR 86 | Temp 99.5°F | Resp 17 | Ht 60.0 in | Wt 133.8 lb

## 2019-06-24 DIAGNOSIS — M549 Dorsalgia, unspecified: Secondary | ICD-10-CM

## 2019-06-24 DIAGNOSIS — N12 Tubulo-interstitial nephritis, not specified as acute or chronic: Secondary | ICD-10-CM

## 2019-06-24 DIAGNOSIS — N3090 Cystitis, unspecified without hematuria: Secondary | ICD-10-CM

## 2019-06-24 LAB — POCT URINALYSIS DIP (MANUAL ENTRY)
Bilirubin, UA: NEGATIVE
Glucose, UA: NEGATIVE mg/dL
Ketones, POC UA: NEGATIVE mg/dL
Nitrite, UA: NEGATIVE
Protein Ur, POC: NEGATIVE mg/dL
Spec Grav, UA: 1.025 (ref 1.010–1.025)
Urobilinogen, UA: 1 E.U./dL
pH, UA: 6.5 (ref 5.0–8.0)

## 2019-06-24 LAB — POC MICROSCOPIC URINALYSIS (UMFC): Mucus: ABSENT

## 2019-06-24 MED ORDER — CEPHALEXIN 500 MG PO CAPS: 500.0000 mg | ORAL_CAPSULE | Freq: Three times a day (TID) | ORAL | 0 refills | Status: DC

## 2019-06-24 NOTE — Patient Instructions (Addendum)
  Take cephalexin 500 mg 1 3 times daily for 1 week  If you are getting acutely worse either return or go to the emergency room.  Things to be concerned about are high fever, shaking chills, increasing pain, vomiting, or other abnormal things.  This is most consistent with a cystitis (bladder infection) that may have a component of pyelonephritis (kidney infection).  Return as needed  We are checking a culture of the urine.  This is done to make sure that the bacteria causing the infection is properly treated with the antibiotic we have chosen, cephalexin.  If the culture shows something else, then we might have to call you and make a change.  Most of the time cephalexin will do the job.      If you have lab work done today you will be contacted with your lab results within the next 2 weeks.  If you have not heard from [place] then please contact us. The fastest way to get your results is to register for My Chart.   IF you received an x-ray today, you will receive an invoice from [facility]. Please contact [facility] at [phone] with questions or concerns regarding your invoice.   IF you received labwork today, you will receive an invoice from [place]. Please contact [facility] at [phone] with questions or concerns regarding your invoice.   Our billing staff will not be able to assist you with questions regarding bills from these companies.  You will be contacted with the lab results as soon as they are available. The fastest way to get your results is to activate your My Chart account. Instructions are located on the last page of this paperwork. If you have not heard from [place] regarding the results in 2 weeks, please contact this office.

## 2019-06-24 NOTE — Progress Notes (Signed)
Patient ID: Rebecca KEMPA, female    DOB: 1990/11/24  Age: 28 y.o. MRN: [id number]  Chief Complaint  Patient presents with  . Back Pain    since last friday 06/18/19, burning and frequency    Subjective:   28 year old lady who has had urinary tract infection symptoms for 1 week.  She had dysuria and flank pain.  The first day she had some chills and fever.  She has tried various OTC analgesic medications for the flank pain, and finally found a combination product that was giving her relief.  At times the symptoms have subsided and she is always gone away, but since Monday it is come back.  She has had UTIs in the past, last one about 6 months ago.  She is single and not sexually involved.  She does nails for a living.  She has not traveled overseas recently.  Current allergies, medications, problem list, past/family and social histories reviewed.  Objective:  BP 108/78 (BP Location: Right Arm, Patient Position: Sitting, Cuff Size: Normal)   Pulse 86   Temp 99.5 F (37.5 C) (Oral)   Resp 17   Ht 5' (1.524 m)   Wt 133 lb 12.8 oz (60.7 kg)   LMP 06/16/2019   SpO2 95%   BMI 26.13 kg/m   No major acute distress.  Mild right CVA tenderness.  Abdomen soft without mass or tenderness.  Results for orders placed or performed in visit on 06/24/19  POCT urinalysis dipstick  Result Value Ref Range   Color, UA yellow yellow   Clarity, UA cloudy (A) clear   Glucose, UA negative negative mg/dL   Bilirubin, UA negative negative   Ketones, POC UA negative negative mg/dL   Spec Grav, UA 1.025 1.010 - 1.025   Blood, UA trace-intact (A) negative   pH, UA 6.5 5.0 - 8.0   Protein Ur, POC negative negative mg/dL   Urobilinogen, UA 1.0 0.2 or 1.0 E.U./dL   Nitrite, UA Negative Negative   Leukocytes, UA Small (1+) (A) Negative  POCT Microscopic Urinalysis ([facility])  Result Value Ref Range   WBC,UR,HPF,POC Too numerous to count  (A) None WBC/hpf   RBC,UR,HPF,POC Many (A) None RBC/hpf   Bacteria Many (A)  None, Too numerous to count   Mucus Absent Absent   Epithelial Cells, UR Per Microscopy Few (A) None, Too numerous to count cells/hpf     Assessment & Plan:   Assessment: 1. Pyelonephritis   2. Acute back pain, unspecified back location, unspecified back pain laterality   3. Cystitis       Plan: Consistent with a UTI with cystitis and probable low-grade pyelonephritis.  See instructions.  Will treat 3 times daily for 1 week because of the probable pyelonephritis.  Orders Placed This Encounter  Procedures  . Urine Culture    Order Specific Question:   Source    Answer:   urine  . POCT urinalysis dipstick  . POCT Microscopic Urinalysis ([facility])    Meds ordered this encounter  Medications  . cephALEXin (KEFLEX) 500 MG capsule    Sig: Take 1 capsule (500 mg total) by mouth 3 (three) times daily.    Dispense:  21 capsule    Refill:  0         Patient Instructions   Nhi?m tru?ng ????ng ti?u, Ng???i l??n Urinary Tract Infection, Adult  Nhi?m trng ???ng ti?u (Urinary Tract Infection, UTI) l m?t b?nh nhi?m trng ? b?t k? b? ph?n no c?a ???ng ti?u. ???ng  ti?u bao g?m hai qu? th?n, ni?u qu?n, bng quang v ni?u ??o. Nh??ng c? quan na?y ta?o ra, ch??a va? tha?i n???c ti?u trong c? th? ra ngoi. Chuyn gia ch?m [place] s?c kh?e c th? s? d?ng cc tn khc ?? m t? b?nh nhi?m trng. UTI trn ?nh h??ng ??n ni?u qu?n v th?n (vim b? th?n). UTI d??i h?n ?nh h??ng ??n bng quang (vim bng quang) v ni?u ??o (vim ni?u ??o). Nguyn nhn g gy ra? H?u h?t cc tr??ng h?p nhi?m trng ???ng ti?u l do vi khu?n ? vng sinh d?c c?a qu v?, quanh l?i vo ???ng ti?u (ni?u ??o) gy ra. Nh?ng vi khu?n ny pht tri?n v gy vim ???ng ti?u c?a qu v?. ?i?u g lm t?ng nguy c?? Qu v? d? b? tnh tr?ng ny h?n n?u:  Quy? vi? ?ang ???c ??t m?t ?ng thng ti?u (n??m trong).  Qu v? khng th? ki?m sot khi no qu v? ti?u ti?n ho?c ??i ti?n (qu v? b? khng t? ch?).  Qu v? l n? gi?i v  qu v?: ? S? d?ng thu?c di?t tinh trng ho?c mng ch?n ?? trnh [place]. ? C n?ng ?? estrogen th?p. ? Co? [place].  Qu v? c m?t s? gen nh?t ??nh lm t?ng nguy c? (di truy?n).  Qu v? c quan h? tnh d?c.  Qu v? dng thu?c khng sinh.  Qu v? c m?t tnh tr?ng khi?n cho dng n??c ti?u ch?y ch?m l?i, ch?ng h?n nh?: ? Tuy?n ti?n li?t ph ??i, n?u qu v? l nam gi?i. ? T?c ngh?n trong ni?u ??o (cht h?p). ? S?i th?n. ? Tnh tr?ng ? dy th?n kinh ?nh h??ng ??n kh? n?ng ki?m sot bng quang (bng quang th?n kinh). ? Khng u?ng ?? n??c, ho?c khng ?i ti?u th??ng xuyn.  Qu v? c m?t s? tnh tr?ng b?nh l nh?t ??nh, ch?ng h?n nh?: ? Ti?u ???ng. ? Co? h? th?ng pho?ng ch?ng b?nh t?t (h? mi?n di?ch) y?u. ? B?nh h?ng c?u hnh li?m. ? B?nh gu?t. ? Ch?n th??ng t?y s?ng. Cc d?u hi?u ho?c tri?u ch?ng g? Nh?ng tri?u ch?ng c?a tnh tr?ng ny bao g?m:  C?n ?i ti?u ngay l?p t?c (mt ti?u).  Ti?u ti?n th???ng xuyn ho??c ?i l???ng nho? n???c ti?u th???ng xuyn.  ?au ho??c no?ng rt khi ti?u ti?n.  Mu trong n??c ti?u.  N???c ti?u co? mu?i hi ho??c b?t th???ng.  Kho? ?i ti?u.  N???c ti?u v?n ?u?c.  Kh h? ?? m ?a?o, n?u quy? vi? la? phu? n??.  ?au ? vng b?ng d??i ho??c th?t l?ng. Qu v? c?ng c th? ???c:  Nn ho?c gi?m c?m gic thm ?n.  L l?n.  Kch thch ho?c m?t m?i.  S?t.  Tiu ch?y. Tri?u ch?ng ??u tin ? ng??i cao tu?i c th? l l l?n. Trong m?t s? tr??ng h?p, h? c th? khng c b?t k? tri?u ch?ng no cho ??n khi nhi?m trng tr? nn tr?m tr?ng h?n. Ch?n ?on tnh tr?ng ny nh? th? no? Tnh tr?ng ny c th? ???c ch?n ?on d?a vo khai thc b?nh s? v khm th?c th?[place]. Qu v? c?ng c th? ph?i lm cc ki?m tra khc, bao g?m:  Xt nghi?m n??c ti?u.  Xt nghi?m mu.  Xt nghi?m cc b?nh nhi?m trng ly truy?n qua ???ng tnh d?c (STI). N?u quy? vi? bi? nhi?u h?n m?t b?nh UTI th c th? c?n ph?i th?c hi?n vi?c n?i soi ba?ng quang ho??c ca?c nghin c?u  hi?nh a?nh ?? xa?c ?  i?nh nguyn nhn gy nhi?m tru?ng. Tnh tr?ng ny ???c ?i?u tr? nh? th? no? ?i?u tr? tnh tr?ng ny bao g?m:  Thu?c khng sinh.  Thu?c khng k ??n ?? lm gi?m c?m gic kh ch?u.  U?ng ?u? n???c ?? c? th? lun ?u? n???c. N?u qu v? b? nhi?m trng th??ng xuyn ho?c c cc tnh tr?ng khc nh? s?i th?n, qu v? c th? c?n ?i khm chuyn gia ch?m [place] s?c kh?e chuyn v? ???ng ti?t ni?u (bc s? chuyn khoa ti?t ni?u). Trong cc tr??ng h?p hi?m g?p, nhi?m trng ???ng ti?u c th? gy ra nhi?m trng huy?t. Nhi?m trng huy?t l tnh tr?ng ?e d?a tnh m?ng x?y ra khi c? th? ?p ?ng v?i m?t tnh tr?ng nhi?m trng. Nhi?m trng huy?t ???c ?i?u tr? trong b?nh vi?n b?ng thu?c khng sinh, d?ch truy?n v cc thu?c khc ???ng t?nh m?ch (IV). Tun th? nh?ng h??ng d?n ny ? nh:  Thu?c  Ch? s? d?ng thu?c khng k ??n v thu?c k ??n theo ch? d?n c?a chuyn gia ch?m [place] s?c kh?e.  N?u qu v? ???c k thu?c khng sinh, hy dng thu?c theo ch? d?n c?a chuyn gia ch?m [place] s?c kh?e. Khng d?ng s? d?ng thu?c khng sinh ngay c? khi qu v? b?t ??u c?m th?y ?? h?n. H??ng d?n chung  ??m b?o qu v?: ? ?i ti?u th???ng xuyn va? h?t bi. Khng nhi?n ?i ti?u trong th??i gian da?i. ? ?i ti?u sau khi quan h? tnh d?c. ? Lau t?? tr???c ra sau sau khi ?i ?a?i ti?n n?u quy? vi? la? n?? gi?i. S?? du?ng m?i t? kh?n gi?y m?t l?n khi quy? vi? lau.  U?ng ?? n??c ?? gi? cho n??c ti?u c mu vng nh?t.  Tun th? t?t c? cc l?n khm theo di theo ch? d?n c?a chuyn gia ch?m [place] s?c kh?e. ?i?u ny c vai tr quan tr?ng. Hy lin l?c v?i chuyn gia ch?m [place] s?c kh?e n?u:  Cc tri?u ch?ng c?a qu v? khng ?? h?n sau 1-2 ngy.  Cc tri?u ch?ng h?t v sau ? ti pht. Yu c?u tr? gip ngay l?p t?c n?u qu v? c:  ?au r?t nhi?u ?? l?ng ho?c b?ng d??i c?a quy? vi?.  S?t.  Bu?n nn ho?c nn. Tm t?t  Nhi?m tru?ng ????ng ti?u (UTI) la? m?t nhi?m tru?ng ?? b?t c?? ph?n na?o cu?a ????ng ti?u,  bao g?m th?n, ni?u qua?n, ba?ng quang va? ni?u ?a?o.  H?u h?t cc tr??ng h?p nhi?m trng ???ng ti?u l do vi khu?n ? vng sinh d?c c?a qu v?, quanh l?i vo ???ng ti?u (ni?u ??o) gy ra.  ?i?u tr? tnh tr?ng ny th??ng bao g?m dng cc thu?c khng sinh.  N?u qu v? ???c k thu?c khng sinh, hy dng thu?c theo ch? d?n c?a chuyn gia ch?m [place] s?c kh?e. Khng d?ng s? d?ng thu?c khng sinh ngay c? khi qu v? b?t ??u c?m th?y ?? h?n.  Tun th? t?t c? cc l?n khm theo di theo ch? d?n c?a chuyn gia ch?m [place] s?c kh?e. ?i?u ny c vai tr quan tr?ng. Thng tin ny khng nh?m m?c ?ch thay th? cho l?i khuyn m chuyn gia ch?m [place] s?c kh?e ni v?i qu v?. Hy b?o ??m qu v? ph?i th?o lu?n b?t k? v?n ?? g m qu v? c v?i chuyn gia ch?m [place] s?c kh?e c?a qu v?. Document Released: 10/07/2005 Document Revised: 09/30/2018 Document Reviewed: 09/30/2018 Elsevier Patient Education  2020 [employer].  If you have lab work done today you will be contacted with your lab results within the next 2 weeks.  If you have not heard from us then please contact us. The fastest way to get your results is to register for My Chart.   IF you received an x-ray today, you will receive an invoice from [facility]. Please contact [facility] at [phone] with questions or concerns regarding your invoice.   IF you received labwork today, you will receive an invoice from [facility]. Please contact [facility] at [phone] with questions or concerns regarding your invoice.   Our billing staff will not be able to assist you with questions regarding bills from these companies.  You will be contacted with the lab results as soon as they are available. The fastest way to get your results is to activate your My Chart account. Instructions are located on the last page of this paperwork. If you have not heard from us regarding the results in 2 weeks, please contact this office.        Return if  symptoms worsen or fail to improve.   Janace Hoardavid Richey Doolittle, MD 06/24/2019

## 2019-06-26 LAB — URINE CULTURE

## 2019-07-08 ENCOUNTER — Other Ambulatory Visit: Payer: Self-pay

## 2019-07-08 ENCOUNTER — Ambulatory Visit (INDEPENDENT_AMBULATORY_CARE_PROVIDER_SITE_OTHER): Payer: Self-pay | Admitting: Family Medicine

## 2019-07-08 ENCOUNTER — Encounter: Payer: Self-pay | Admitting: Family Medicine

## 2019-07-08 VITALS — BP 110/75 | HR 91 | Temp 98.0°F | Resp 18 | Ht 60.0 in | Wt 135.8 lb

## 2019-07-08 DIAGNOSIS — R8281 Pyuria: Secondary | ICD-10-CM

## 2019-07-08 DIAGNOSIS — R300 Dysuria: Secondary | ICD-10-CM

## 2019-07-08 LAB — POCT URINALYSIS DIP (MANUAL ENTRY)
Bilirubin, UA: NEGATIVE
Blood, UA: NEGATIVE
Glucose, UA: NEGATIVE mg/dL
Ketones, POC UA: NEGATIVE mg/dL
Nitrite, UA: NEGATIVE
Protein Ur, POC: NEGATIVE mg/dL
Spec Grav, UA: 1.025 (ref 1.010–1.025)
Urobilinogen, UA: 0.2 E.U./dL
pH, UA: 6.5 (ref 5.0–8.0)

## 2019-07-08 MED ORDER — CIPROFLOXACIN HCL 500 MG PO TABS: 500.0000 mg | ORAL_TABLET | Freq: Two times a day (BID) | ORAL | 0 refills | Status: AC

## 2019-07-08 NOTE — Patient Instructions (Signed)
Continue to drink lots of water  Take ciprofloxacin 500 mg 1 twice daily for infection for 1 week to make sure the infection is completely cleared up.  Return in about 2 weeks for a follow-up urinalysis.  I have put in an order for this, and you should be able to get the specimen without having to see the physician.  Return if worse.

## 2019-07-08 NOTE — Progress Notes (Signed)
Patient ID: Rebecca Bryan, female    DOB: October 11, 1991  Age: 28 y.o. MRN: [id number]  Chief Complaint  Patient presents with  . Urinary Tract Infection    Subjective:   Patient was recently treated for pyelonephritis.  She feels much better.  No symptoms now.  Came for recheck of her urine to be certain things were clear.  Current allergies, medications, problem list, past/family and social histories reviewed.  Objective:  BP 110/75 (BP Location: Right Arm, Patient Position: Sitting, Cuff Size: Normal)   Pulse 91   Temp 98 F (36.7 C) (Oral)   Resp 18   Ht 5' (1.524 m)   Wt 135 lb 12.8 oz (61.6 kg)   LMP 06/16/2019   SpO2 98%   BMI 26.52 kg/m   No CVA tenderness.  Urine still has a lot of leukocytes.  On microscope is broken so we can only rely on the dip today.  It is being sent out.  Assessment & Plan:   Assessment: 1. Dysuria   2. Pyuria       Plan: Persistent UTI.  See instructions.  Orders Placed This Encounter  Procedures  . Urine Culture  . Urine Microscopic  . POCT urinalysis dipstick  . POCT Microscopic Urinalysis ([facility])    Standing Status:   Future    Standing Expiration Date:   08/07/2019    Scheduling Instructions:     Follow-up because leukocytes persisted  . POCT urinalysis dipstick    Standing Status:   Future    Standing Expiration Date:   08/07/2019    Scheduling Instructions:     Follow-up because leukocytes persisted    Meds ordered this encounter  Medications  . ciprofloxacin (CIPRO) 500 MG tablet    Sig: Take 1 tablet (500 mg total) by mouth 2 (two) times daily.    Dispense:  14 tablet    Refill:  0         Patient Instructions  Continue to drink lots of water  Take ciprofloxacin 500 mg 1 twice daily for infection for 1 week to make sure the infection is completely cleared up.  Return in about 2 weeks for a follow-up urinalysis.  I have put in an order for this, and you should be able to get the specimen without having to see  the physician.  Return if worse.    Return in about 2 weeks (around 07/22/2019), or Does not seem to need to see Dr., for Follow-up urinalysis.   Ruben Reason, MD 07/08/2019

## 2019-07-09 LAB — URINALYSIS, MICROSCOPIC ONLY: Casts: NONE SEEN /lpf

## 2019-07-10 LAB — URINE CULTURE

## 2019-07-14 NOTE — Progress Notes (Signed)
Left message for pt to call bk for lab results

## 2019-07-22 ENCOUNTER — Ambulatory Visit: Payer: Self-pay | Admitting: Family Medicine

## 2019-07-23 ENCOUNTER — Encounter: Payer: Self-pay | Admitting: Family Medicine

## 2021-06-21 ENCOUNTER — Ambulatory Visit
Admission: EM | Admit: 2021-06-21 | Discharge: 2021-06-21 | Disposition: A | Discharge status: Home / Self Care | Payer: Medicaid Other

## 2021-06-21 ENCOUNTER — Other Ambulatory Visit: Payer: Self-pay

## 2021-06-21 ENCOUNTER — Encounter: Payer: Self-pay | Admitting: Emergency Medicine

## 2021-06-21 DIAGNOSIS — R519 Headache, unspecified: Secondary | ICD-10-CM

## 2021-06-21 DIAGNOSIS — R0602 Shortness of breath: Secondary | ICD-10-CM

## 2021-06-21 NOTE — Discharge Instructions (Addendum)
Please go the hospital as soon as you leave urgent care for further evaluation and management. 

## 2021-06-21 NOTE — ED Triage Notes (Signed)
Patient c/o headache on and off for several weeks.  Patient has taken Tylenol.  Patient is vaccinated for COVID.

## 2021-06-21 NOTE — ED Provider Notes (Addendum)
[facility]    CSN: [id number] Arrival date & time: 06/21/21  1036      History   Chief Complaint Chief Complaint  Patient presents with   Headache    HPI Rebecca Bryan is a 30 y.o. female.   Patient presents with severe headache that has been present for 2 to 3 weeks.  Patient describes headache 8/10 that is located on the top of the head and feels like a "pressure".  Patient also has associated dizziness.  Headache has become constant.  Headache worsens when lying flat.  Patient has taken Tylenol over-the-counter without relief of symptoms.  Patient also has associated intermittent shortness of breath, abdominal pain, lower back pain that started around the same time that headache did.  Denies any history of migraines or any chronic illnesses.  Denies any fever, urinary burning, urinary frequency, any upper respiratory symptoms, nausea, vomiting, blurred vision, chest pain.  Last menstrual cycle was approximately 1 month ago.   Headache  Past Medical History:  Diagnosis Date   Late prenatal care    Medical history non-contributory    No pertinent past medical history     Patient Active Problem List   Diagnosis Date Noted   Normal labor 04/12/2017   Full-term premature rupture of membranes 04/12/2017   Pyelonephritis affecting pregnancy in first trimester 09/16/2016    Past Surgical History:  Procedure Laterality Date   NO PAST SURGERIES      OB History     Gravida  5   Para  4   Term  4   Preterm  0   AB  1   Living  4      SAB  1   IAB  0   Ectopic  0   Multiple  0   Live Births  4            Home Medications    Prior to Admission medications   Medication Sig Start Date End Date Taking? Authorizing Provider  ciprofloxacin (CIPRO) 500 MG tablet Take 1 tablet (500 mg total) by mouth 2 (two) times daily. 07/08/19   Peyton Najjar, MD    Family History No family history on file.  Social History Social History   Tobacco  Use   Smoking status: Never   Smokeless tobacco: Never  Substance Use Topics   Alcohol use: No   Drug use: No     Allergies   Patient has no known allergies.   Review of Systems Review of Systems Per HPI  Physical Exam Triage Vital Signs ED Triage Vitals  Enc Vitals Group     BP 06/21/21 1201 117/74     Pulse Rate 06/21/21 1201 82     Resp 06/21/21 1201 18     Temp 06/21/21 1201 98 F (36.7 C)     Temp Source 06/21/21 1201 Oral     SpO2 06/21/21 1201 98 %     Weight 06/21/21 1202 135 lb (61.2 kg)     Height 06/21/21 1202 5' (1.524 m)     Head Circumference --      Peak Flow --      Pain Score 06/21/21 1202 8     Pain Loc --      Pain Edu? --      Excl. in GC? --    No data found.  Updated Vital Signs BP 117/74 (BP Location: Left Arm)   Pulse 82   Temp 98 F (36.7  C) (Oral)   Resp 18   Ht 5' (1.524 m)   Wt 135 lb (61.2 kg)   LMP 05/21/2021   SpO2 98%   BMI 26.37 kg/m   Visual Acuity Right Eye Distance:   Left Eye Distance:   Bilateral Distance:    Right Eye Near:   Left Eye Near:    Bilateral Near:     Physical Exam Constitutional:      Appearance: Normal appearance.  HENT:     Head: Normocephalic and atraumatic.     Right Ear: Tympanic membrane and ear canal normal.     Left Ear: Tympanic membrane and ear canal normal.     Nose: Nose normal.     Mouth/Throat:     Mouth: Mucous membranes are moist.     Pharynx: No posterior oropharyngeal erythema.  Eyes:     Extraocular Movements: Extraocular movements intact.     Conjunctiva/sclera: Conjunctivae normal.  Cardiovascular:     Rate and Rhythm: Normal rate and regular rhythm.     Pulses: Normal pulses.     Heart sounds: Normal heart sounds.  Pulmonary:     Effort: Pulmonary effort is normal. No respiratory distress.     Breath sounds: No wheezing or rhonchi.  Abdominal:     General: Abdomen is flat. Bowel sounds are normal. There is no distension.     Palpations: Abdomen is soft.      Tenderness: There is no abdominal tenderness.  Skin:    General: Skin is warm and dry.  Neurological:     General: No focal deficit present.     Mental Status: She is alert and oriented to person, place, and time. Mental status is at baseline.     Cranial Nerves: Cranial nerves are intact.     Sensory: Sensation is intact.     Motor: Motor function is intact.     Coordination: Coordination is intact.     Gait: Gait is intact.  Psychiatric:        Mood and Affect: Mood normal.        Behavior: Behavior normal.        Thought Content: Thought content normal.        Judgment: Judgment normal.     UC Treatments / Results  Labs (all labs ordered are listed, but only abnormal results are displayed) Labs Reviewed - No data to display  EKG   Radiology No results found.  Procedures Procedures (including critical care time)  Medications Ordered in UC Medications - No data to display  Initial Impression / Assessment and Plan / UC Course  I have reviewed the triage vital signs and the nursing notes.  Pertinent labs & imaging results that were available during my care of the patient were reviewed by me and considered in my medical decision making (see chart for details).     Due to severity of headache, duration of symptoms, and associated shortness of breath, patient was advised to go to the hospital for further evaluation and management.  Patient was agreeable with plan.  Vital signs  and patient are stable at discharge, so agree with patient self transport to the hospital. Patient declined interpreter.  Final Clinical Impressions(s) / UC Diagnoses   Final diagnoses:  Severe headache  Shortness of breath     Discharge Instructions      Please go the hospital as soon as you leave urgent care for further evaluation and management.     ED Prescriptions  None    PDMP not reviewed this encounter.   Lance Muss, FNP 06/21/21 1221    Lance Muss,  FNP 06/21/21 1224

## 2021-06-26 ENCOUNTER — Emergency Department (HOSPITAL_COMMUNITY)
Admission: EM | Admit: 2021-06-26 | Discharge: 2021-06-27 | Disposition: A | Discharge status: Home / Self Care | Payer: Self-pay | Attending: Emergency Medicine | Admitting: Emergency Medicine

## 2021-06-26 ENCOUNTER — Other Ambulatory Visit: Payer: Self-pay

## 2021-06-26 DIAGNOSIS — R1084 Generalized abdominal pain: Secondary | ICD-10-CM | POA: Insufficient documentation

## 2021-06-26 DIAGNOSIS — J019 Acute sinusitis, unspecified: Secondary | ICD-10-CM | POA: Insufficient documentation

## 2021-06-26 DIAGNOSIS — R110 Nausea: Secondary | ICD-10-CM | POA: Insufficient documentation

## 2021-06-26 DIAGNOSIS — R42 Dizziness and giddiness: Secondary | ICD-10-CM | POA: Insufficient documentation

## 2021-06-26 DIAGNOSIS — R0602 Shortness of breath: Secondary | ICD-10-CM | POA: Insufficient documentation

## 2021-06-26 NOTE — ED Provider Notes (Signed)
Emergency Medicine Provider Triage Evaluation Note  Rebecca Bryan , a 30 y.o. female  was evaluated in triage.  Pt complains of headache.  Pain located to the crown of head.  Pain has been present over the last 2 weeks.  Denies any aggravating factors.  Minimal relief with over-the-counter pain medication.  Denies any recent falls or injuries.  No numbness, weakness, facial asymmetry, slurred speech, visual disturbance.    Review of Systems  Positive: Headache Negative: numbness, weakness, facial asymmetry, slurred speech, visual disturbance  Physical Exam  BP (!) 107/92 (BP Location: Left Arm)   Pulse 81   Temp (!) 97.5 F (36.4 C) (Oral)   Resp 16   SpO2 99%  Gen:   Awake, no distress   Resp:  Normal effort  MSK:   Moves extremities without difficulty  Other:  CN II through XII intact.  Grip strength equal.  +5 strength to bilateral upper and lower extremities.  Pronator drift negative  Medical Decision Making  Medically screening exam initiated at 8:21 PM.  Appropriate orders placed.  [place] H [facility] was informed that the remainder of the evaluation will be completed by another provider, this initial triage assessment does not replace that evaluation, and the importance of remaining in the ED until their evaluation is complete.     Haskel Schroeder, PA-C 06/26/21 2023    Maia Plan, MD 06/26/21 2351

## 2021-06-26 NOTE — ED Triage Notes (Signed)
Pt reports a headache for the past 2 weeks.

## 2021-06-27 ENCOUNTER — Emergency Department (HOSPITAL_COMMUNITY): Payer: Self-pay

## 2021-06-27 LAB — CBC WITH DIFFERENTIAL/PLATELET
Abs Immature Granulocytes: 0.03 10*3/uL (ref 0.00–0.07)
Basophils Absolute: 0 10*3/uL (ref 0.0–0.1)
Basophils Relative: 1 %
Eosinophils Absolute: 0.3 10*3/uL (ref 0.0–0.5)
Eosinophils Relative: 5 %
HCT: 40 % (ref 36.0–46.0)
Hemoglobin: 12.8 g/dL (ref 12.0–15.0)
Immature Granulocytes: 1 %
Lymphocytes Relative: 31 %
Lymphs Abs: 1.8 10*3/uL (ref 0.7–4.0)
MCH: 23.2 pg — ABNORMAL LOW (ref 26.0–34.0)
MCHC: 32 g/dL (ref 30.0–36.0)
MCV: 72.6 fL — ABNORMAL LOW (ref 80.0–100.0)
Monocytes Absolute: 0.7 10*3/uL (ref 0.1–1.0)
Monocytes Relative: 13 %
Neutro Abs: 3 10*3/uL (ref 1.7–7.7)
Neutrophils Relative %: 49 %
Platelets: 279 10*3/uL (ref 150–400)
RBC: 5.51 MIL/uL — ABNORMAL HIGH (ref 3.87–5.11)
RDW: 13.9 % (ref 11.5–15.5)
WBC: 5.8 10*3/uL (ref 4.0–10.5)
nRBC: 0 % (ref 0.0–0.2)

## 2021-06-27 LAB — I-STAT BETA HCG BLOOD, ED (MC, WL, AP ONLY): I-stat hCG, quantitative: <5 m[IU]/mL (ref <5)

## 2021-06-27 LAB — COMPREHENSIVE METABOLIC PANEL
ALT: 52 U/L — ABNORMAL HIGH (ref 0–44)
AST: 33 U/L (ref 15–41)
Albumin: 3.8 g/dL (ref 3.5–5.0)
Alkaline Phosphatase: 89 U/L (ref 38–126)
Anion gap: 8 (ref 5–15)
BUN: 10 mg/dL (ref 6–20)
CO2: 24 mmol/L (ref 22–32)
Calcium: 8.9 mg/dL (ref 8.9–10.3)
Chloride: 104 mmol/L (ref 98–111)
Creatinine, Ser: 0.6 mg/dL (ref 0.44–1.00)
GFR, Estimated: >60 mL/min (ref >60)
Glucose, Bld: 88 mg/dL (ref 70–99)
Potassium: 3.8 mmol/L (ref 3.5–5.1)
Sodium: 136 mmol/L (ref 135–145)
Total Bilirubin: 0.4 mg/dL (ref 0.3–1.2)
Total Protein: 7.3 g/dL (ref 6.5–8.1)

## 2021-06-27 LAB — LIPASE, BLOOD: Lipase: 28 U/L (ref 11–51)

## 2021-06-27 MED ORDER — SODIUM CHLORIDE 0.9 % IV BOLUS
1000.0000 mL | Freq: Once | INTRAVENOUS | Status: AC
  Administered 2021-06-27: 1000 mL via INTRAVENOUS

## 2021-06-27 MED ORDER — AMOXICILLIN-POT CLAVULANATE 875-125 MG PO TABS: 1.0000 | ORAL_TABLET | Freq: Two times a day (BID) | ORAL | 0 refills | Status: AC

## 2021-06-27 MED ORDER — METOCLOPRAMIDE HCL 5 MG/ML IJ SOLN
10.0000 mg | Freq: Once | INTRAMUSCULAR | Status: AC
  Administered 2021-06-27: 10 mg via INTRAVENOUS
  Filled 2021-06-27: qty 2

## 2021-06-27 MED ORDER — ONDANSETRON 4 MG PO TBDP: 4.0000 mg | ORAL_TABLET | Freq: Three times a day (TID) | ORAL | 0 refills | Status: AC | PRN

## 2021-06-27 MED ORDER — DIPHENHYDRAMINE HCL 50 MG/ML IJ SOLN
12.5000 mg | Freq: Once | INTRAMUSCULAR | Status: AC
  Administered 2021-06-27: 12.5 mg via INTRAVENOUS
  Filled 2021-06-27: qty 1

## 2021-06-27 NOTE — ED Provider Notes (Signed)
[facility] EMERGENCY DEPARTMENT Provider Note   CSN: [id number] Arrival date & time: 06/26/21  1854     History Chief Complaint  Patient presents with   Headache   Dizziness    Rebecca Bryan is a 30 y.o. female presenting to the ED with multiple complaints. 1.  Reports headache.  2-week history of constant headache at the top of her head and associated congestion and sinus pressure.  She has tried taking Tylenol and Advil with only minimal improvement in her symptoms.  She typically does have headaches but this 1 has been persistent unlike her past headaches.  She denies any vision changes, numbness in arms or legs, injuries or falls, neck stiffness, fever, personal or family history of aneurysms.  Last took Advil last night.  Reports associated nausea and her pain gets worse with loud sounds. 2.  Reports shortness of breath.  This has been present when she lays down at night and improves during the day.  She denies any chest pain, reports some associated cough.  Denies any leg swelling, hemoptysis. 2.  Reports abdominal pain.  She is reporting pain throughout her entire abdomen.  Denies any vomiting, changes to bowel movements, urination or vaginal complaints.  This started over the past few days and she is unsure if this is being caused by her headache.  HPI     Past Medical History:  Diagnosis Date   Late prenatal care    Medical history non-contributory    No pertinent past medical history     Patient Active Problem List   Diagnosis Date Noted   Normal labor 04/12/2017   Full-term premature rupture of membranes 04/12/2017   Pyelonephritis affecting pregnancy in first trimester 09/16/2016    Past Surgical History:  Procedure Laterality Date   NO PAST SURGERIES       OB History     Gravida  5   Para  4   Term  4   Preterm  0   AB  1   Living  4      SAB  1   IAB  0   Ectopic  0   Multiple  0   Live Births  4           No  family history on file.  Social History   Tobacco Use   Smoking status: Never   Smokeless tobacco: Never  Substance Use Topics   Alcohol use: No   Drug use: No    Home Medications Prior to Admission medications   Medication Sig Start Date End Date Taking? Authorizing Provider  amoxicillin-clavulanate (AUGMENTIN) 875-125 MG tablet Take 1 tablet by mouth every 12 (twelve) hours for 7 days. 06/27/21 07/04/21 Yes Jasara Corrigan, PA-C  ondansetron (ZOFRAN ODT) 4 MG disintegrating tablet Take 1 tablet (4 mg total) by mouth every 8 (eight) hours as needed for nausea or vomiting. 06/27/21  Yes Kellis Topete, PA-C  ciprofloxacin (CIPRO) 500 MG tablet Take 1 tablet (500 mg total) by mouth 2 (two) times daily. 07/08/19   Peyton Najjar, MD    Allergies    Patient has no known allergies.  Review of Systems   Review of Systems  HENT:  Positive for sinus pain. Negative for rhinorrhea and sneezing.   Eyes:  Negative for visual disturbance.  Respiratory:  Negative for chest tightness and wheezing.   Skin:  Negative for rash.   Physical Exam Updated Vital Signs BP 103/78   Pulse 64  Temp 98.2 F (36.8 C) (Oral)   Resp 16   SpO2 100%   Physical Exam Vitals and nursing note reviewed.  Constitutional:      General: She is not in acute distress.    Appearance: She is well-developed.  HENT:     Head: Normocephalic and atraumatic.     Nose: Nose normal.  Eyes:     General: No scleral icterus.       Right eye: No discharge.        Left eye: No discharge.     Conjunctiva/sclera: Conjunctivae normal.     Pupils: Pupils are equal, round, and reactive to light.  Neck:     Comments: No meningismus. Cardiovascular:     Rate and Rhythm: Normal rate and regular rhythm.     Heart sounds: Normal heart sounds. No murmur heard.   No friction rub. No gallop.  Pulmonary:     Effort: Pulmonary effort is normal. No respiratory distress.     Breath sounds: Normal breath sounds.  Abdominal:      General: Bowel sounds are normal. There is no distension.     Palpations: Abdomen is soft.     Tenderness: There is abdominal tenderness (Generalized, no focal tenderness, no guarding). There is no guarding.  Musculoskeletal:        General: No tenderness. Normal range of motion.     Cervical back: Normal range of motion and neck supple.     Right lower leg: No edema.     Left lower leg: No edema.     Comments: No lower extremity edema, erythema or calf tenderness bilaterally  Skin:    General: Skin is warm and dry.     Findings: No rash.  Neurological:     General: No focal deficit present.     Mental Status: She is alert and oriented to person, place, and time.     Cranial Nerves: No cranial nerve deficit.     Sensory: No sensory deficit.     Motor: No weakness or abnormal muscle tone.     Coordination: Coordination normal.     Comments: Pupils reactive. No facial asymmetry noted. Cranial nerves appear grossly intact. Sensation intact to light touch on face, BUE and BLE. Strength 5/5 in BUE and BLE.    ED Results / Procedures / Treatments   Labs (all labs ordered are listed, but only abnormal results are displayed) Labs Reviewed  COMPREHENSIVE METABOLIC PANEL - Abnormal; Notable for the following components:      Result Value   ALT 52 (*)    All other components within normal limits  CBC WITH DIFFERENTIAL/PLATELET - Abnormal; Notable for the following components:   RBC 5.51 (*)    MCV 72.6 (*)    MCH 23.2 (*)    All other components within normal limits  LIPASE, BLOOD  I-STAT BETA HCG BLOOD, ED ([facility], [facility], [facility] ONLY)    EKG EKG Interpretation  Date/Time:  Wednesday June 27 2021 10:06:11 EDT Ventricular Rate:  63 PR Interval:  134 QRS Duration: 82 QT Interval:  406 QTC Calculation: 415 R Axis:   72 Text Interpretation: Normal sinus rhythm Normal ECG Confirmed by Virgina Norfolkuratolo, Adam ([id number]) on 06/27/2021 10:12:00 AM  Radiology DG Chest 2 View  Result Date:  06/27/2021 CLINICAL DATA:  Shortness of breath, headache and dizziness. Left-sided chest pain for 2 weeks. EXAM: CHEST - 2 VIEW COMPARISON:  Radiographs 12/26/2018. FINDINGS: The heart size and mediastinal contours are normal. The lungs  are clear. There is no pleural effusion or pneumothorax. No acute osseous findings are identified. IMPRESSION: Stable chest.  No active cardiopulmonary process. Electronically Signed   By: Carey Bullocks M.D.   On: 06/27/2021 09:54    Procedures Procedures   Medications Ordered in ED Medications  sodium chloride 0.9 % bolus 1,000 mL (1,000 mLs Intravenous New Bag/Given 06/27/21 1140)  diphenhydrAMINE (BENADRYL) injection 12.5 mg (12.5 mg Intravenous Given 06/27/21 1140)  metoCLOPramide (REGLAN) injection 10 mg (10 mg Intravenous Given 06/27/21 1142)    ED Course  I have reviewed the triage vital signs and the nursing notes.  Pertinent labs & imaging results that were available during my care of the patient were reviewed by me and considered in my medical decision making (see chart for details).  Clinical Course as of 06/27/21 1258  Wed Jun 27, 2021  0953 I-stat hCG, quantitative: <5.0 [HK]  1024 WBC: 5.8 [HK]  1024 Hemoglobin: 12.8 [HK]  1024 DG Chest 2 View Negative. [HK]  1041 Creatinine: 0.60 [HK]  1041 ALT(!): 52 [HK]  1041 Lipase: 28 [HK]    Clinical Course User Index [HK] Dietrich Pates, PA-C   MDM Rules/Calculators/A&P                           30 year old female presenting for multiple complaints. 1.  Headache.  Consistent headache for the past 2 weeks.  Reports associated pressure behind her eyes and in her sinuses.  No neurological deficits seen on exam.  No meningeal signs.  She denies any injury or trauma.  Will attempt to treat with migraine cocktail but suspect that she will ultimately need antibiotics due to concern for sinus infection for over 10 days.  12:53 PM Patient with resolution of her symptoms with migraine cocktail. There are no  headache characteristics that are lateralizing or concerning for increased ICP, infectious or vascular cause of her symptoms.  We will have her take Augmentin to help with sinusitis and NSAIDs as needed.   #2 shortness of breath.  Is worse when she lays down.  No signs of lower extremity edema on exam.  She denies chest pain.  Vital signs within normal limits.  Will obtain EKG and chest x-ray  12:54 PM Chest x-ray is negative for acute abnormality.  EKG shows normal sinus rhythm.  She does not appear fluid overloaded on exam.  Lungs are clear.  Oxygen saturations are 100% on room air. Doubt PE as she is negative.  No structural cause seen on x-ray.  We will have her follow-up with PCP regarding this.   #3 reports abdominal pain.  Reports nausea but denies any vomiting, changes to bowel movements, urination or pelvic or vaginal complaints.  On exam abdomen is generally tender but no focal tenderness, no rebound or guarding. Will hope antiemetic and migraine cocktail helped to relieve some of this discomfort.  We will check labs.  12:55 PM Lab work including CBC, CMP and lipase unremarkable.  Given Reglan with improvement.  Repeat abdominal exams are benign.  I suspect that this could be due to nausea caused by her headache. I doubt appendicitis, cholecystitis, or other surgical emergent cause based on her reassuring work-up and physical exam findings. Will treat with antiemetics.  Patient is agreeable to the plan.  Return precautions given.   Patient is hemodynamically stable, in NAD, and able to ambulate in the ED. Evaluation does not show pathology that would require ongoing emergent intervention or  inpatient treatment. I explained the diagnosis to the patient. Pain has been managed and has no complaints prior to discharge. Patient is comfortable with above plan and is stable for discharge at this time. All questions were answered prior to disposition. Strict return precautions for returning to  the ED were discussed. Encouraged follow up with PCP.   An After Visit Summary was printed and given to the patient.   Portions of this note were generated with Scientist, clinical (histocompatibility and immunogenetics). Dictation errors may occur despite best attempts at proofreading.  Final Clinical Impression(s) / ED Diagnoses Final diagnoses:  Acute sinusitis, recurrence not specified, unspecified location  Nausea    Rx / DC Orders ED Discharge Orders          Ordered    amoxicillin-clavulanate (AUGMENTIN) 875-125 MG tablet  Every 12 hours        06/27/21 1258    ondansetron (ZOFRAN ODT) 4 MG disintegrating tablet  Every 8 hours PRN        06/27/21 1258             Dietrich Pates, PA-C 06/27/21 1258    Curatolo, Adam, DO 06/27/21 1430

## 2021-06-27 NOTE — ED Notes (Signed)
DC instructions reviewed with pt. PT verbalized understanding. Pt DC °

## 2021-06-27 NOTE — Discharge Instructions (Addendum)
Your headache may be caused by a sinus infection based on the location and symptoms.  Take the antibiotics to help with this. Take the Zofran as needed if you start to have nausea. You can continue Tylenol and Motrin as needed to help with pain. Return to the ER if you start to experience worsening headache, blurry vision, numbness in arms or legs, worsening chest pain, leg swelling.

## 2021-06-27 NOTE — ED Notes (Addendum)
DG Chest 2 View not completed.

## 2021-06-27 NOTE — ED Notes (Addendum)
Patient transported to X-ray 

## 2022-01-10 ENCOUNTER — Other Ambulatory Visit: Payer: Self-pay
